# Patient Record
Sex: Male | Born: 1965
Health system: Southern US, Community
[De-identification: ages and names within clinical notes are randomized; demographics above are authoritative.]

## PROBLEM LIST (undated history)

## (undated) DIAGNOSIS — R011 Cardiac murmur, unspecified: Secondary | ICD-10-CM

## (undated) DIAGNOSIS — E119 Type 2 diabetes mellitus without complications: Secondary | ICD-10-CM

## (undated) DIAGNOSIS — I10 Essential (primary) hypertension: Secondary | ICD-10-CM

## (undated) SURGERY — Surgical Case
Anesthesia: *Unknown

---

## 1998-07-29 ENCOUNTER — Emergency Department (HOSPITAL_COMMUNITY): Admission: EM | Admit: 1998-07-29 | Discharge: 1998-07-30 | Payer: Self-pay | Admitting: Internal Medicine

## 1999-06-05 ENCOUNTER — Ambulatory Visit (HOSPITAL_COMMUNITY): Admission: RE | Admit: 1999-06-05 | Discharge: 1999-06-05 | Payer: Self-pay | Admitting: Cardiology

## 1999-08-17 ENCOUNTER — Ambulatory Visit (HOSPITAL_COMMUNITY): Admission: RE | Admit: 1999-08-17 | Discharge: 1999-08-17 | Payer: Self-pay | Admitting: Gastroenterology

## 2004-03-23 ENCOUNTER — Ambulatory Visit (HOSPITAL_COMMUNITY): Admission: RE | Admit: 2004-03-23 | Discharge: 2004-03-23 | Payer: Self-pay | Admitting: Cardiology

## 2004-04-01 ENCOUNTER — Ambulatory Visit: Admission: RE | Admit: 2004-04-01 | Discharge: 2004-04-01 | Payer: Self-pay | Admitting: Cardiology

## 2004-04-01 ENCOUNTER — Encounter (INDEPENDENT_AMBULATORY_CARE_PROVIDER_SITE_OTHER): Payer: Self-pay | Admitting: Cardiology

## 2005-03-18 ENCOUNTER — Encounter: Admission: RE | Admit: 2005-03-18 | Discharge: 2005-03-18 | Payer: Self-pay | Admitting: Internal Medicine

## 2008-12-27 HISTORY — PX: BACK SURGERY: SHX140

## 2009-06-02 ENCOUNTER — Emergency Department (HOSPITAL_COMMUNITY): Admission: EM | Admit: 2009-06-02 | Discharge: 2009-06-02 | Payer: Self-pay | Admitting: Emergency Medicine

## 2009-08-27 ENCOUNTER — Emergency Department (HOSPITAL_COMMUNITY): Admission: EM | Admit: 2009-08-27 | Discharge: 2009-08-27 | Payer: Self-pay | Admitting: Family Medicine

## 2009-10-31 ENCOUNTER — Ambulatory Visit (HOSPITAL_COMMUNITY): Admission: RE | Admit: 2009-10-31 | Discharge: 2009-11-01 | Payer: Self-pay | Admitting: Neurosurgery

## 2011-03-31 LAB — CBC
Hemoglobin: 14.9 g/dL (ref 13.0–17.0)
MCHC: 33.5 g/dL (ref 30.0–36.0)
MCV: 80.2 fL (ref 78.0–100.0)
RBC: 5.52 MIL/uL (ref 4.22–5.81)
RDW: 13.5 % (ref 11.5–15.5)

## 2011-03-31 LAB — BASIC METABOLIC PANEL
GFR calc Af Amer: >60 mL/min (ref >60)
Glucose, Bld: 124 mg/dL — ABNORMAL HIGH (ref 70–99)
Potassium: 4.1 mEq/L (ref 3.5–5.1)

## 2011-04-22 ENCOUNTER — Inpatient Hospital Stay (INDEPENDENT_AMBULATORY_CARE_PROVIDER_SITE_OTHER)
Admission: RE | Admit: 2011-04-22 | Discharge: 2011-04-22 | Disposition: A | Discharge status: Home / Self Care | Payer: PRIVATE HEALTH INSURANCE | Source: Ambulatory Visit | Attending: Emergency Medicine | Admitting: Emergency Medicine

## 2011-04-22 DIAGNOSIS — T23009A Burn of unspecified degree of unspecified hand, unspecified site, initial encounter: Secondary | ICD-10-CM

## 2014-09-17 ENCOUNTER — Other Ambulatory Visit: Payer: Self-pay | Admitting: Orthopedic Surgery

## 2014-09-17 DIAGNOSIS — M5416 Radiculopathy, lumbar region: Secondary | ICD-10-CM

## 2014-09-23 ENCOUNTER — Ambulatory Visit
Admission: RE | Admit: 2014-09-23 | Discharge: 2014-09-23 | Disposition: A | Discharge status: Home / Self Care | Payer: Worker's Compensation | Source: Ambulatory Visit | Attending: Orthopedic Surgery | Admitting: Orthopedic Surgery

## 2014-09-23 DIAGNOSIS — M5416 Radiculopathy, lumbar region: Secondary | ICD-10-CM

## 2014-09-23 MED ORDER — IOHEXOL 180 MG/ML  SOLN
1.0000 mL | Freq: Once | INTRAMUSCULAR | Status: AC | PRN
  Administered 2014-09-23: 1 mL via EPIDURAL

## 2014-09-23 MED ORDER — METHYLPREDNISOLONE ACETATE 40 MG/ML INJ SUSP (RADIOLOG
120.0000 mg | Freq: Once | INTRAMUSCULAR | Status: AC
  Administered 2014-09-23: 120 mg via EPIDURAL

## 2014-09-23 NOTE — Discharge Instructions (Signed)

## 2014-11-28 ENCOUNTER — Other Ambulatory Visit: Payer: Self-pay | Admitting: Orthopedic Surgery

## 2014-12-13 ENCOUNTER — Encounter (HOSPITAL_COMMUNITY): Payer: Self-pay | Admitting: Pharmacy Technician

## 2014-12-13 NOTE — Pre-Procedure Instructions (Addendum)
Devin Gilmore  12/13/2014   Your procedure is scheduled on:  12/25/14  [facility] at 730 AM.  Call this number if you have problems the morning of surgery: [phone]   Remember:   Do not eat food or drink liquids after midnight.   Take these medicines the morning of surgery with A SIP OF WATER: flexeril,pain med if needed    STOP all herbel meds, nsaids (aleve,naproxen,advil,ibuprofen) 5 days prior to surgery starting 12/20/14 including vitamins, aspirin    Do not wear jewelry, make-up or nail polish.  Do not wear lotions, powders, or perfumes. You may wear deodorant.  Do not shave 48 hours prior to surgery. Men may shave face and neck.  Do not bring valuables to the hospital.  [facility] not responsible                  for any belongings or valuables.               Contacts, dentures or bridgework may not be worn into surgery.  Leave suitcase in the car. After surgery it may be brought to your room.  For patients admitted to the hospital, discharge time is determined by your                treatment team.               Patients discharged the day of surgery will not be allowed to drive  home.  Name and phone number of your driver:   Special Instructions:  Special Instructions: Devin Gilmore - Preparing for Surgery  Before surgery, you can play an important role.  Because skin is not sterile, your skin needs to be as free of germs as possible.  You can reduce the number of germs on you skin by washing with CHG (chlorahexidine gluconate) soap before surgery.  CHG is an antiseptic cleaner which kills germs and bonds with the skin to continue killing germs even after washing.  Please DO NOT use if you have an allergy to CHG or antibacterial soaps.  If your skin becomes reddened/irritated stop using the CHG and inform your nurse when you arrive at Short Stay.  Do not shave (including legs and underarms) for at least 48 hours prior to the first  CHG shower.  You may shave your face.  Please follow these instructions carefully:   1.  Shower with CHG Soap the night before surgery and the morning of Surgery.  2.  If you choose to wash your hair, wash your hair first as usual with your normal shampoo.  3.  After you shampoo, rinse your hair and body thoroughly to remove the Shampoo.  4.  Use CHG as you would any other liquid soap.  You can apply chg directly  to the skin and wash gently with scrungie or a clean washcloth.  5.  Apply the CHG Soap to your body ONLY FROM THE NECK DOWN.  Do not use on open wounds or open sores.  Avoid contact with your eyes ears, mouth and genitals (private parts).  Wash genitals (private parts)       with your normal soap.  6.  Wash thoroughly, paying special attention to the area where your surgery will be performed.  7.  Thoroughly rinse your body with warm water from the neck down.  8.  DO NOT shower/wash with your normal soap after using and rinsing off the CHG Soap.  9.  Pat yourself dry with a clean towel.            10.  Wear clean pajamas.            11.  Place clean sheets on your bed the night of your first shower and do not sleep with pets.  Day of Surgery  Do not apply any lotions/deodorants the morning of surgery.  Please wear clean clothes to the hospital/surgery center.   Please read over the following fact sheets that you were given: Pain Booklet, Coughing and Deep Breathing, Blood Transfusion Information, MRSA Information and Surgical Site Infection Prevention

## 2014-12-16 ENCOUNTER — Encounter (HOSPITAL_COMMUNITY): Payer: Self-pay

## 2014-12-16 ENCOUNTER — Encounter (HOSPITAL_COMMUNITY)
Admission: RE | Admit: 2014-12-16 | Discharge: 2014-12-16 | Disposition: A | Discharge status: Home / Self Care | Payer: Worker's Compensation | Source: Ambulatory Visit | Attending: Orthopedic Surgery | Admitting: Orthopedic Surgery

## 2014-12-16 DIAGNOSIS — R011 Cardiac murmur, unspecified: Secondary | ICD-10-CM | POA: Insufficient documentation

## 2014-12-16 DIAGNOSIS — Z01818 Encounter for other preprocedural examination: Secondary | ICD-10-CM | POA: Insufficient documentation

## 2014-12-16 DIAGNOSIS — I454 Nonspecific intraventricular block: Secondary | ICD-10-CM | POA: Diagnosis not present

## 2014-12-16 HISTORY — DX: Cardiac murmur, unspecified: R01.1

## 2014-12-16 LAB — CBC WITH DIFFERENTIAL/PLATELET
Basophils Absolute: 0 10*3/uL (ref 0.0–0.1)
Basophils Relative: 0 % (ref 0–1)
EOS PCT: 3 % (ref 0–5)
Eosinophils Absolute: 0.2 10*3/uL (ref 0.0–0.7)
HEMATOCRIT: 42.7 % (ref 39.0–52.0)
Hemoglobin: 14.3 g/dL (ref 13.0–17.0)
LYMPHS ABS: 1.7 10*3/uL (ref 0.7–4.0)
LYMPHS PCT: 28 % (ref 12–46)
MCH: 26.4 pg (ref 26.0–34.0)
MCHC: 33.5 g/dL (ref 30.0–36.0)
MCV: 78.9 fL (ref 78.0–100.0)
MONO ABS: 0.7 10*3/uL (ref 0.1–1.0)
Monocytes Relative: 11 % (ref 3–12)
Neutro Abs: 3.5 10*3/uL (ref 1.7–7.7)
Neutrophils Relative %: 58 % (ref 43–77)
Platelets: 262 10*3/uL (ref 150–400)
RBC: 5.41 MIL/uL (ref 4.22–5.81)
RDW: 13.7 % (ref 11.5–15.5)
WBC: 6 10*3/uL (ref 4.0–10.5)

## 2014-12-16 LAB — COMPREHENSIVE METABOLIC PANEL
ALT: 28 U/L (ref 0–53)
ANION GAP: 12 (ref 5–15)
AST: 22 U/L (ref 0–37)
Albumin: 3.9 g/dL (ref 3.5–5.2)
Alkaline Phosphatase: 75 U/L (ref 39–117)
BUN: 11 mg/dL (ref 6–23)
CO2: 27 meq/L (ref 19–32)
CREATININE: 1.01 mg/dL (ref 0.50–1.35)
Calcium: 9.2 mg/dL (ref 8.4–10.5)
Chloride: 101 mEq/L (ref 96–112)
GFR calc Af Amer: >90 mL/min (ref >90)
GFR, EST NON AFRICAN AMERICAN: 86 mL/min — AB (ref >90)
GLUCOSE: 234 mg/dL — AB (ref 70–99)
Potassium: 4.4 mEq/L (ref 3.7–5.3)
SODIUM: 140 meq/L (ref 137–147)
TOTAL PROTEIN: 8.1 g/dL (ref 6.0–8.3)
Total Bilirubin: 0.3 mg/dL (ref 0.3–1.2)

## 2014-12-16 LAB — URINALYSIS, ROUTINE W REFLEX MICROSCOPIC
Bilirubin Urine: NEGATIVE
Glucose, UA: NEGATIVE mg/dL
Hgb urine dipstick: NEGATIVE
KETONES UR: NEGATIVE mg/dL
LEUKOCYTES UA: NEGATIVE
NITRITE: NEGATIVE
PROTEIN: NEGATIVE mg/dL
Specific Gravity, Urine: 1.026 (ref 1.005–1.030)
UROBILINOGEN UA: 0.2 mg/dL (ref 0.0–1.0)
pH: 5.5 (ref 5.0–8.0)

## 2014-12-16 LAB — APTT: aPTT: 32 seconds (ref 24–37)

## 2014-12-16 LAB — SURGICAL PCR SCREEN
MRSA, PCR: NEGATIVE
Staphylococcus aureus: NEGATIVE

## 2014-12-16 LAB — PROTIME-INR
INR: 0.96 (ref 0.00–1.49)
PROTHROMBIN TIME: 12.9 s (ref 11.6–15.2)

## 2014-12-24 MED ORDER — POVIDONE-IODINE 7.5 % EX SOLN
Freq: Once | CUTANEOUS | Status: DC
  Filled 2014-12-24: qty 118

## 2014-12-24 MED ORDER — CEFAZOLIN SODIUM-DEXTROSE 2-3 GM-% IV SOLR
2.0000 g | INTRAVENOUS | Status: AC
  Administered 2014-12-25 (×2): 2 g via INTRAVENOUS
  Filled 2014-12-24: qty 50

## 2014-12-25 ENCOUNTER — Inpatient Hospital Stay (HOSPITAL_COMMUNITY): Payer: Worker's Compensation | Admitting: Anesthesiology

## 2014-12-25 ENCOUNTER — Encounter (HOSPITAL_COMMUNITY)
Admission: RE | Disposition: A | Discharge status: Home Health | Payer: PRIVATE HEALTH INSURANCE | Source: Ambulatory Visit | Attending: Orthopedic Surgery

## 2014-12-25 ENCOUNTER — Inpatient Hospital Stay (HOSPITAL_COMMUNITY): Payer: Worker's Compensation

## 2014-12-25 ENCOUNTER — Inpatient Hospital Stay (HOSPITAL_COMMUNITY)
Admission: RE | Admit: 2014-12-25 | Discharge: 2014-12-28 | DRG: 460 | Disposition: A | Discharge status: Home Health | Payer: Worker's Compensation | Source: Ambulatory Visit | Attending: Orthopedic Surgery | Admitting: Orthopedic Surgery

## 2014-12-25 DIAGNOSIS — M79604 Pain in right leg: Secondary | ICD-10-CM

## 2014-12-25 DIAGNOSIS — M5116 Intervertebral disc disorders with radiculopathy, lumbar region: Secondary | ICD-10-CM | POA: Diagnosis present

## 2014-12-25 DIAGNOSIS — M5416 Radiculopathy, lumbar region: Principal | ICD-10-CM | POA: Diagnosis present

## 2014-12-25 DIAGNOSIS — M5410 Radiculopathy, site unspecified: Secondary | ICD-10-CM | POA: Diagnosis present

## 2014-12-25 DIAGNOSIS — Z01818 Encounter for other preprocedural examination: Secondary | ICD-10-CM

## 2014-12-25 DIAGNOSIS — I10 Essential (primary) hypertension: Secondary | ICD-10-CM | POA: Diagnosis present

## 2014-12-25 DIAGNOSIS — Z419 Encounter for procedure for purposes other than remedying health state, unspecified: Secondary | ICD-10-CM

## 2014-12-25 HISTORY — PX: SPINAL CORD DECOMPRESSION: SHX97

## 2014-12-25 LAB — TYPE AND SCREEN
ABO/RH(D): O POS
Antibody Screen: NEGATIVE

## 2014-12-25 LAB — ABO/RH: ABO/RH(D): O POS

## 2014-12-25 SURGERY — POSTERIOR LUMBAR FUSION 1 LEVEL
Anesthesia: General | Site: Spine Lumbar | Laterality: Right

## 2014-12-25 MED ORDER — THROMBIN 20000 UNITS EX SOLR
CUTANEOUS | Status: DC | PRN
  Administered 2014-12-25: 20000 mL via TOPICAL

## 2014-12-25 MED ORDER — DIPHENHYDRAMINE HCL 50 MG/ML IJ SOLN: 12.5000 mg | Freq: Four times a day (QID) | INTRAMUSCULAR | Status: DC | PRN

## 2014-12-25 MED ORDER — SODIUM CHLORIDE 0.9 % IJ SOLN
9.0000 mL | INTRAMUSCULAR | Status: DC | PRN
Start: 2014-12-25 — End: 2014-12-26

## 2014-12-25 MED ORDER — PHENOL 1.4 % MT LIQD: 1.0000 | OROMUCOSAL | Status: DC | PRN

## 2014-12-25 MED ORDER — ACETAMINOPHEN 650 MG RE SUPP: 650.0000 mg | RECTAL | Status: DC | PRN

## 2014-12-25 MED ORDER — MORPHINE SULFATE 2 MG/ML IJ SOLN
1.0000 mg | INTRAMUSCULAR | Status: DC | PRN
  Administered 2014-12-26 – 2014-12-27 (×4): 4 mg via INTRAVENOUS
  Administered 2014-12-27 (×2): 2 mg via INTRAVENOUS
  Filled 2014-12-25 (×2): qty 1
  Filled 2014-12-25 (×3): qty 2
  Filled 2014-12-25: qty 1
  Filled 2014-12-25: qty 2

## 2014-12-25 MED ORDER — LIDOCAINE HCL (CARDIAC) 20 MG/ML IV SOLN
INTRAVENOUS | Status: AC
  Filled 2014-12-25: qty 5

## 2014-12-25 MED ORDER — NALOXONE HCL 0.4 MG/ML IJ SOLN: 0.4000 mg | INTRAMUSCULAR | Status: DC | PRN

## 2014-12-25 MED ORDER — ONDANSETRON HCL 4 MG/2ML IJ SOLN
4.0000 mg | Freq: Four times a day (QID) | INTRAMUSCULAR | Status: DC | PRN
  Administered 2014-12-26: 4 mg via INTRAVENOUS
  Filled 2014-12-25: qty 2

## 2014-12-25 MED ORDER — FLEET ENEMA 7-19 GM/118ML RE ENEM: 1.0000 | ENEMA | Freq: Once | RECTAL | Status: AC | PRN

## 2014-12-25 MED ORDER — LIDOCAINE HCL 4 % MT SOLN
OROMUCOSAL | Status: DC | PRN
  Administered 2014-12-25: 4 mL via TOPICAL

## 2014-12-25 MED ORDER — NEOSTIGMINE METHYLSULFATE 10 MG/10ML IV SOLN
INTRAVENOUS | Status: AC
  Filled 2014-12-25: qty 1

## 2014-12-25 MED ORDER — ROCURONIUM BROMIDE 100 MG/10ML IV SOLN
INTRAVENOUS | Status: DC | PRN
  Administered 2014-12-25: 10 mg via INTRAVENOUS
  Administered 2014-12-25: 5 mg via INTRAVENOUS
  Administered 2014-12-25: 50 mg via INTRAVENOUS

## 2014-12-25 MED ORDER — DIPHENHYDRAMINE HCL 12.5 MG/5ML PO ELIX
12.5000 mg | ORAL_SOLUTION | Freq: Four times a day (QID) | ORAL | Status: DC | PRN
Start: 2014-12-25 — End: 2014-12-26

## 2014-12-25 MED ORDER — MENTHOL 3 MG MT LOZG: 1.0000 | LOZENGE | OROMUCOSAL | Status: DC | PRN

## 2014-12-25 MED ORDER — SODIUM CHLORIDE 0.9 % IV SOLN
INTRAVENOUS | Status: DC | PRN
  Administered 2014-12-25: 15:00:00 via INTRAVENOUS

## 2014-12-25 MED ORDER — SODIUM CHLORIDE 0.9 % IJ SOLN
3.0000 mL | Freq: Two times a day (BID) | INTRAMUSCULAR | Status: DC
  Administered 2014-12-26 – 2014-12-28 (×2): 3 mL via INTRAVENOUS

## 2014-12-25 MED ORDER — ROCURONIUM BROMIDE 50 MG/5ML IV SOLN
INTRAVENOUS | Status: AC
  Filled 2014-12-25: qty 1

## 2014-12-25 MED ORDER — SUFENTANIL CITRATE 50 MCG/ML IV SOLN
INTRAVENOUS | Status: AC
  Filled 2014-12-25: qty 1

## 2014-12-25 MED ORDER — BISACODYL 5 MG PO TBEC: 5.0000 mg | DELAYED_RELEASE_TABLET | Freq: Every day | ORAL | Status: DC | PRN

## 2014-12-25 MED ORDER — SODIUM CHLORIDE 0.9 % IV SOLN
100.2000 mg | INTRAVENOUS | Status: DC | PRN
  Administered 2014-12-25: 100.2 mg via INTRAVENOUS

## 2014-12-25 MED ORDER — ONDANSETRON HCL 4 MG/2ML IJ SOLN: 4.0000 mg | INTRAMUSCULAR | Status: DC | PRN

## 2014-12-25 MED ORDER — PROPOFOL 10 MG/ML IV BOLUS
INTRAVENOUS | Status: AC
  Filled 2014-12-25: qty 20

## 2014-12-25 MED ORDER — ARTIFICIAL TEARS OP OINT
TOPICAL_OINTMENT | OPHTHALMIC | Status: AC
  Filled 2014-12-25: qty 3.5

## 2014-12-25 MED ORDER — ALUM & MAG HYDROXIDE-SIMETH 200-200-20 MG/5ML PO SUSP: 30.0000 mL | Freq: Four times a day (QID) | ORAL | Status: DC | PRN

## 2014-12-25 MED ORDER — ACETAMINOPHEN 325 MG PO TABS: 650.0000 mg | ORAL_TABLET | ORAL | Status: DC | PRN

## 2014-12-25 MED ORDER — LACTATED RINGERS IV SOLN
INTRAVENOUS | Status: DC | PRN
  Administered 2014-12-25 (×3): via INTRAVENOUS

## 2014-12-25 MED ORDER — 0.9 % SODIUM CHLORIDE (POUR BTL) OPTIME
TOPICAL | Status: DC | PRN
  Administered 2014-12-25: 1000 mL

## 2014-12-25 MED ORDER — SENNOSIDES-DOCUSATE SODIUM 8.6-50 MG PO TABS: 1.0000 | ORAL_TABLET | Freq: Every evening | ORAL | Status: DC | PRN

## 2014-12-25 MED ORDER — NEOSTIGMINE METHYLSULFATE 10 MG/10ML IV SOLN
INTRAVENOUS | Status: DC | PRN
  Administered 2014-12-25: 3 mg via INTRAVENOUS

## 2014-12-25 MED ORDER — FENTANYL CITRATE 0.05 MG/ML IJ SOLN
25.0000 ug | INTRAMUSCULAR | Status: DC | PRN
  Administered 2014-12-25 (×2): 50 ug via INTRAVENOUS

## 2014-12-25 MED ORDER — PROPOFOL INFUSION 10 MG/ML OPTIME
INTRAVENOUS | Status: DC | PRN
  Administered 2014-12-25: 50 ug/kg/min via INTRAVENOUS

## 2014-12-25 MED ORDER — MEPERIDINE HCL 25 MG/ML IJ SOLN: 6.2500 mg | INTRAMUSCULAR | Status: DC | PRN

## 2014-12-25 MED ORDER — PROPOFOL 10 MG/ML IV BOLUS
INTRAVENOUS | Status: DC | PRN
  Administered 2014-12-25: 180 mg via INTRAVENOUS
  Administered 2014-12-25: 30 mg via INTRAVENOUS

## 2014-12-25 MED ORDER — CEFAZOLIN SODIUM 1-5 GM-% IV SOLN
1.0000 g | Freq: Three times a day (TID) | INTRAVENOUS | Status: AC
  Administered 2014-12-26 (×2): 1 g via INTRAVENOUS
  Filled 2014-12-25 (×2): qty 50

## 2014-12-25 MED ORDER — SUFENTANIL CITRATE 50 MCG/ML IV SOLN
INTRAVENOUS | Status: DC | PRN
  Administered 2014-12-25 (×2): 10 ug via INTRAVENOUS
  Administered 2014-12-25 (×2): 5 ug via INTRAVENOUS
  Administered 2014-12-25 (×4): 10 ug via INTRAVENOUS
  Administered 2014-12-25: 5 ug via INTRAVENOUS

## 2014-12-25 MED ORDER — SODIUM CHLORIDE 0.9 % IJ SOLN
INTRAMUSCULAR | Status: AC
  Filled 2014-12-25: qty 20

## 2014-12-25 MED ORDER — OXYCODONE-ACETAMINOPHEN 5-325 MG PO TABS
1.0000 | ORAL_TABLET | ORAL | Status: DC | PRN
  Administered 2014-12-26 – 2014-12-28 (×12): 2 via ORAL
  Filled 2014-12-25 (×13): qty 2

## 2014-12-25 MED ORDER — MORPHINE SULFATE (PF) 1 MG/ML IV SOLN
INTRAVENOUS | Status: DC
Start: 2014-12-25 — End: 2014-12-26
  Administered 2014-12-25 (×2): via INTRAVENOUS
  Administered 2014-12-25: 6 mg via INTRAVENOUS
  Administered 2014-12-26: 03:00:00 via INTRAVENOUS
  Filled 2014-12-25 (×2): qty 25

## 2014-12-25 MED ORDER — DOCUSATE SODIUM 100 MG PO CAPS
100.0000 mg | ORAL_CAPSULE | Freq: Two times a day (BID) | ORAL | Status: DC
  Administered 2014-12-26 – 2014-12-28 (×6): 100 mg via ORAL
  Filled 2014-12-25 (×9): qty 1

## 2014-12-25 MED ORDER — GLYCOPYRROLATE 0.2 MG/ML IJ SOLN
INTRAMUSCULAR | Status: AC
  Filled 2014-12-25: qty 2

## 2014-12-25 MED ORDER — SUCCINYLCHOLINE CHLORIDE 20 MG/ML IJ SOLN
INTRAMUSCULAR | Status: DC | PRN
  Administered 2014-12-25: 100 mg via INTRAVENOUS

## 2014-12-25 MED ORDER — ARTIFICIAL TEARS OP OINT
TOPICAL_OINTMENT | OPHTHALMIC | Status: DC | PRN
  Administered 2014-12-25: 1 via OPHTHALMIC

## 2014-12-25 MED ORDER — LIDOCAINE HCL (CARDIAC) 20 MG/ML IV SOLN
INTRAVENOUS | Status: DC | PRN
  Administered 2014-12-25: 100 mg via INTRAVENOUS

## 2014-12-25 MED ORDER — ONDANSETRON HCL 4 MG/2ML IJ SOLN
INTRAMUSCULAR | Status: AC
  Filled 2014-12-25: qty 2

## 2014-12-25 MED ORDER — MIDAZOLAM HCL 5 MG/5ML IJ SOLN
INTRAMUSCULAR | Status: DC | PRN
  Administered 2014-12-25: 2 mg via INTRAVENOUS

## 2014-12-25 MED ORDER — LACTATED RINGERS IV SOLN
INTRAVENOUS | Status: DC
  Administered 2014-12-25: 08:00:00 via INTRAVENOUS

## 2014-12-25 MED ORDER — ONDANSETRON HCL 4 MG/2ML IJ SOLN
INTRAMUSCULAR | Status: DC | PRN
  Administered 2014-12-25: 4 mg via INTRAVENOUS

## 2014-12-25 MED ORDER — BUPIVACAINE-EPINEPHRINE (PF) 0.25% -1:200000 IJ SOLN
INTRAMUSCULAR | Status: AC
  Filled 2014-12-25: qty 30

## 2014-12-25 MED ORDER — THROMBIN 20000 UNITS EX KIT
PACK | CUTANEOUS | Status: DC | PRN
  Administered 2014-12-25: 40000 [IU] via TOPICAL

## 2014-12-25 MED ORDER — DIAZEPAM 5 MG PO TABS
5.0000 mg | ORAL_TABLET | Freq: Four times a day (QID) | ORAL | Status: DC | PRN
  Administered 2014-12-25 – 2014-12-28 (×10): 5 mg via ORAL
  Filled 2014-12-25 (×10): qty 1

## 2014-12-25 MED ORDER — SODIUM CHLORIDE 0.9 % IJ SOLN: 3.0000 mL | INTRAMUSCULAR | Status: DC | PRN

## 2014-12-25 MED ORDER — GLYCOPYRROLATE 0.2 MG/ML IJ SOLN
INTRAMUSCULAR | Status: DC | PRN
  Administered 2014-12-25: 0.4 mg via INTRAVENOUS

## 2014-12-25 MED ORDER — GABAPENTIN 300 MG PO CAPS
300.0000 mg | ORAL_CAPSULE | Freq: Three times a day (TID) | ORAL | Status: DC
  Administered 2014-12-26 – 2014-12-28 (×8): 300 mg via ORAL
  Filled 2014-12-25 (×11): qty 1

## 2014-12-25 MED ORDER — SODIUM CHLORIDE 0.9 % IV SOLN
INTRAVENOUS | Status: DC
  Administered 2014-12-25 – 2014-12-26 (×2): via INTRAVENOUS

## 2014-12-25 MED ORDER — THROMBIN 20000 UNITS EX SOLR
CUTANEOUS | Status: AC
  Filled 2014-12-25: qty 40000

## 2014-12-25 MED ORDER — MORPHINE SULFATE (PF) 1 MG/ML IV SOLN
INTRAVENOUS | Status: AC
  Filled 2014-12-25: qty 25

## 2014-12-25 MED ORDER — SODIUM CHLORIDE 0.9 % IV SOLN: 250.0000 mL | INTRAVENOUS | Status: DC

## 2014-12-25 MED ORDER — MIDAZOLAM HCL 2 MG/2ML IJ SOLN
INTRAMUSCULAR | Status: AC
  Filled 2014-12-25: qty 2

## 2014-12-25 MED ORDER — PROMETHAZINE HCL 25 MG/ML IJ SOLN
6.2500 mg | INTRAMUSCULAR | Status: DC | PRN
Start: 2014-12-25 — End: 2014-12-25

## 2014-12-25 MED ORDER — FENTANYL CITRATE 0.05 MG/ML IJ SOLN
INTRAMUSCULAR | Status: AC
  Administered 2014-12-25: 50 ug via INTRAVENOUS
  Filled 2014-12-25: qty 2

## 2014-12-25 MED ORDER — METHYLENE BLUE 1 % INJ SOLN
INTRAMUSCULAR | Status: AC
  Filled 2014-12-25: qty 10

## 2014-12-25 SURGICAL SUPPLY — 85 items
BENZOIN TINCTURE PRP APPL 2/3 (GAUZE/BANDAGES/DRESSINGS) ×2 IMPLANT
BIT DRILL MOUNTAINER FXED 12MM (DRILL) ×1 IMPLANT
BLADE SURG ROTATE 9660 (MISCELLANEOUS) IMPLANT
BUR PRESCISION 1.7 ELITE (BURR) ×2 IMPLANT
BUR ROUND PRECISION 4.0 (BURR) ×2 IMPLANT
CAGE BULLET CONCORDE 9X9X27 (Cage) ×2 IMPLANT
CARTRIDGE OIL MAESTRO DRILL (MISCELLANEOUS) ×1 IMPLANT
CLSR STERI-STRIP ANTIMIC 1/2X4 (GAUZE/BANDAGES/DRESSINGS) ×2 IMPLANT
CONT SPEC STER OR (MISCELLANEOUS) ×2 IMPLANT
CORDS BIPOLAR (ELECTRODE) ×2 IMPLANT
COVER MAYO STAND STRL (DRAPES) ×4 IMPLANT
COVER SURGICAL LIGHT HANDLE (MISCELLANEOUS) ×2 IMPLANT
DIFFUSER DRILL AIR PNEUMATIC (MISCELLANEOUS) ×2 IMPLANT
DRAIN CHANNEL 15F RND FF W/TCR (WOUND CARE) IMPLANT
DRAPE C-ARM 42X72 X-RAY (DRAPES) ×2 IMPLANT
DRAPE ORTHO SPLIT 77X108 STRL (DRAPES) ×1
DRAPE POUCH INSTRU U-SHP 10X18 (DRAPES) ×2 IMPLANT
DRAPE SURG 17X23 STRL (DRAPES) ×6 IMPLANT
DRAPE SURG ORHT 6 SPLT 77X108 (DRAPES) ×1 IMPLANT
DRILL MOUNTAINEER FIXED 12MM (DRILL) ×2
DURAPREP 26ML APPLICATOR (WOUND CARE) ×4 IMPLANT
ELECT BLADE 4.0 EZ CLEAN MEGAD (MISCELLANEOUS) ×2
ELECT CAUTERY BLADE 6.4 (BLADE) ×2 IMPLANT
ELECT REM PT RETURN 9FT ADLT (ELECTROSURGICAL) ×2
ELECTRODE BLDE 4.0 EZ CLN MEGD (MISCELLANEOUS) ×1 IMPLANT
ELECTRODE REM PT RTRN 9FT ADLT (ELECTROSURGICAL) ×1 IMPLANT
EVACUATOR SILICONE 100CC (DRAIN) IMPLANT
GAUZE SPONGE 4X4 12PLY STRL (GAUZE/BANDAGES/DRESSINGS) ×2 IMPLANT
GAUZE SPONGE 4X4 16PLY XRAY LF (GAUZE/BANDAGES/DRESSINGS) ×8 IMPLANT
GLOVE BIO SURGEON STRL SZ7 (GLOVE) ×2 IMPLANT
GLOVE BIO SURGEON STRL SZ8 (GLOVE) ×2 IMPLANT
GLOVE BIOGEL PI IND STRL 7.0 (GLOVE) ×1 IMPLANT
GLOVE BIOGEL PI IND STRL 8 (GLOVE) ×2 IMPLANT
GLOVE BIOGEL PI INDICATOR 7.0 (GLOVE) ×1
GLOVE BIOGEL PI INDICATOR 8 (GLOVE) ×2
GOWN STRL REUS W/ TWL LRG LVL3 (GOWN DISPOSABLE) ×2 IMPLANT
GOWN STRL REUS W/ TWL XL LVL3 (GOWN DISPOSABLE) ×1 IMPLANT
GOWN STRL REUS W/TWL LRG LVL3 (GOWN DISPOSABLE) ×2
GOWN STRL REUS W/TWL XL LVL3 (GOWN DISPOSABLE) ×1
IV CATH 14GX2 1/4 (CATHETERS) ×2 IMPLANT
KIT BASIN OR (CUSTOM PROCEDURE TRAY) ×4 IMPLANT
KIT POSITION SURG JACKSON T1 (MISCELLANEOUS) ×2 IMPLANT
KIT ROOM TURNOVER OR (KITS) ×2 IMPLANT
MARKER SKIN DUAL TIP RULER LAB (MISCELLANEOUS) ×2 IMPLANT
MIX DBX 10CC 35% BONE (Bone Implant) ×2 IMPLANT
NDL SAFETY ECLIPSE 18X1.5 (NEEDLE) ×1 IMPLANT
NEEDLE 22X1 1/2 (OR ONLY) (NEEDLE) ×2 IMPLANT
NEEDLE BONE MARROW 8GX6 FENEST (NEEDLE) IMPLANT
NEEDLE HYPO 18GX1.5 SHARP (NEEDLE) ×1
NEEDLE HYPO 25GX1X1/2 BEV (NEEDLE) ×2 IMPLANT
NEEDLE SPNL 18GX3.5 QUINCKE PK (NEEDLE) ×8 IMPLANT
NEEDLE SPNL 20GX3.5 QUINCKE YW (NEEDLE) ×4 IMPLANT
NEURO MONITORING STIM (LABOR (TRAVEL & OVERTIME)) ×2 IMPLANT
NS IRRIG 1000ML POUR BTL (IV SOLUTION) ×8 IMPLANT
OIL CARTRIDGE MAESTRO DRILL (MISCELLANEOUS) ×2
PACK LAMINECTOMY ORTHO (CUSTOM PROCEDURE TRAY) ×2 IMPLANT
PACK UNIVERSAL I (CUSTOM PROCEDURE TRAY) ×4 IMPLANT
PAD ARMBOARD 7.5X6 YLW CONV (MISCELLANEOUS) ×4 IMPLANT
PATTIES SURGICAL .5 X1 (DISPOSABLE) ×2 IMPLANT
PATTIES SURGICAL .5X1.5 (GAUZE/BANDAGES/DRESSINGS) ×2 IMPLANT
ROD PRE LORDOSED 5.5X45 (Rod) ×4 IMPLANT
SCREW SET SINGLE INNER (Screw) ×8 IMPLANT
SCREW VIPER CORT FIX 5.00X35 (Screw) ×6 IMPLANT
SCREW VIPER CORT FIX 5.00X40 (Screw) ×2 IMPLANT
SPONGE GAUZE 4X4 12PLY STER LF (GAUZE/BANDAGES/DRESSINGS) ×2 IMPLANT
SPONGE INTESTINAL PEANUT (DISPOSABLE) ×4 IMPLANT
SPONGE SURGIFOAM ABS GEL 100 (HEMOSTASIS) ×2 IMPLANT
STRIP CLOSURE SKIN 1/2X4 (GAUZE/BANDAGES/DRESSINGS) ×4 IMPLANT
SURGIFLO TRUKIT (HEMOSTASIS) IMPLANT
SUT MNCRL AB 4-0 PS2 18 (SUTURE) ×4 IMPLANT
SUT VIC AB 0 CT1 18XCR BRD 8 (SUTURE) ×1 IMPLANT
SUT VIC AB 0 CT1 8-18 (SUTURE) ×1
SUT VIC AB 1 CT1 18XCR BRD 8 (SUTURE) ×2 IMPLANT
SUT VIC AB 1 CT1 8-18 (SUTURE) ×4
SUT VIC AB 2-0 CT2 18 VCP726D (SUTURE) ×2 IMPLANT
SYR 20CC LL (SYRINGE) ×2 IMPLANT
SYR BULB IRRIGATION 50ML (SYRINGE) ×2 IMPLANT
SYR CONTROL 10ML LL (SYRINGE) ×4 IMPLANT
SYR TB 1ML LUER SLIP (SYRINGE) ×2 IMPLANT
TAPE CLOTH SURG 4X10 WHT LF (GAUZE/BANDAGES/DRESSINGS) ×2 IMPLANT
TOWEL OR 17X24 6PK STRL BLUE (TOWEL DISPOSABLE) ×2 IMPLANT
TOWEL OR 17X26 10 PK STRL BLUE (TOWEL DISPOSABLE) ×2 IMPLANT
TRAY FOLEY CATH 16FRSI W/METER (SET/KITS/TRAYS/PACK) ×2 IMPLANT
WATER STERILE IRR 1000ML POUR (IV SOLUTION) ×2 IMPLANT
YANKAUER SUCT BULB TIP NO VENT (SUCTIONS) ×4 IMPLANT

## 2014-12-25 NOTE — Progress Notes (Signed)
[facility]

## 2014-12-25 NOTE — Anesthesia Postprocedure Evaluation (Signed)
  Anesthesia Post-op Note  Patient: Devin Gilmore  Procedure(s) Performed: Procedure(s) with comments: POSTERIOR LUMBAR FUSION 1 LEVEL (Right) - Revision lumbar 3-4 decompression, right sided lumbar 3-4 transforaminal lumbar interbody fusion with instrumentation, allograft.  Patient Location: PACU  Anesthesia Type:General  Level of Consciousness: awake and alert   Airway and Oxygen Therapy: Patient Spontanous Breathing  Post-op Pain: mild  Post-op Assessment: Post-op Vital signs reviewed, Patient's Cardiovascular Status Stable and Respiratory Function Stable  Post-op Vital Signs: Reviewed  Filed Vitals:   12/25/14 1725  BP: 158/94  Pulse:   Temp:   Resp:     Complications: No apparent anesthesia complications

## 2014-12-25 NOTE — Transfer of Care (Signed)
Immediate Anesthesia Transfer of Care Note  Patient: Devin Gilmore  Procedure(s) Performed: Procedure(s) with comments: POSTERIOR LUMBAR FUSION 1 LEVEL (Right) - Revision lumbar 3-4 decompression, right sided lumbar 3-4 transforaminal lumbar interbody fusion with instrumentation, allograft.  Patient Location: PACU  Anesthesia Type:General  Level of Consciousness: awake, sedated and patient cooperative  Airway & Oxygen Therapy: Patient Spontanous Breathing and Patient connected to nasal cannula oxygen  Post-op Assessment: Report given to PACU RN, Post -op Vital signs reviewed and stable and Patient moving all extremities  Post vital signs: Reviewed and stable  Complications: No apparent anesthesia complications

## 2014-12-25 NOTE — H&P (Signed)
     PREOPERATIVE H&P  Chief Complaint: Right leg pain  HPI: Devin Gilmore is a 48 y.o. male who presents with ongoing pain in the right leg  MRI reveals an extraforminal HNP, causing compression of right L3 nerve. Patient is s/p a previous L3/4 decompression.  Patient has failed multiple forms of conservative care and continues to have pain (see office notes for additional details regarding the patient's full course of treatment)  Past Medical History  Diagnosis Date  . Heart murmur     echo 05 no problems   Past Surgical History  Procedure Laterality Date  . Back surgery  2010   History   Social History  . Marital Status: Married    Spouse Name: N/A    Number of Children: N/A  . Years of Education: N/A   Social History Main Topics  . Smoking status: Never Smoker   . Smokeless tobacco: Not on file  . Alcohol Use: No  . Drug Use: No  . Sexual Activity: Not on file   Other Topics Concern  . Not on file   Social History Narrative  . No narrative on file   No family history on file. No Known Allergies Prior to Admission medications   Medication Sig Start Date End Date Taking? Authorizing Provider  cyclobenzaprine (FLEXERIL) 5 MG tablet Take 5 mg by mouth daily as needed for muscle spasms.   Yes Historical Provider, MD  gabapentin (NEURONTIN) 300 MG capsule Take 300 mg by mouth 3 (three) times daily.   Yes Historical Provider, MD  traMADol (ULTRAM) 50 MG tablet Take 50 mg by mouth every 6 (six) hours as needed for moderate pain.   Yes Historical Provider, MD     All other systems have been reviewed and were otherwise negative with the exception of those mentioned in the HPI and as above.  Physical Exam: Filed Vitals:   12/25/14 0748  BP: 152/93  Pulse: 66  Temp: 97.9 F (36.6 C)  Resp: 20    General: Alert, no acute distress Cardiovascular: No pedal edema Respiratory: No cyanosis, no use of accessory musculature Skin: No lesions in the area of  chief complaint Neurologic: Sensation intact distally Psychiatric: Patient is competent for consent with normal mood and affect Lymphatic: No axillary or cervical lymphadenopathy  MUSCULOSKELETAL: 1+ right patellar reflex  Assessment/Plan: Right leg pain Plan for Procedure(s): POSTERIOR LUMBAR DECOMPRESSION AND FUSION 1 LEVEL   Emilee HeroUMONSKI,Devin Brabant LEONARD, MD 12/25/2014 8:01 AM

## 2014-12-25 NOTE — Anesthesia Procedure Notes (Signed)
Procedure Name: Intubation Date/Time: 12/25/2014 11:03 AM Performed by: Izora Gala Pre-anesthesia Checklist: Patient identified, Emergency Drugs available, Suction available and Patient being monitored Patient Re-evaluated:Patient Re-evaluated prior to inductionOxygen Delivery Method: Circle system utilized Preoxygenation: Pre-oxygenation with 100% oxygen Intubation Type: IV induction Ventilation: Mask ventilation without difficulty Laryngoscope Size: Miller and 3 Grade View: Grade II Tube type: Oral Tube size: 8.0 mm Number of attempts: 1 Airway Equipment and Method: Stylet and LTA kit utilized Placement Confirmation: ETT inserted through vocal cords under direct vision,  positive ETCO2 and breath sounds checked- equal and bilateral Secured at: 23 cm Tube secured with: Tape Dental Injury: Teeth and Oropharynx as per pre-operative assessment

## 2014-12-25 NOTE — Anesthesia Preprocedure Evaluation (Addendum)
Anesthesia Evaluation  Patient identified by MRN, date of birth, ID band Patient awake    Reviewed: Allergy & Precautions, H&P , NPO status , Patient's Chart, lab work & pertinent test results, reviewed documented beta blocker date and time   Airway Mallampati: II   Neck ROM: Full    Dental  (+) Teeth Intact   Pulmonary neg pulmonary ROS,  breath sounds clear to auscultation        Cardiovascular Exercise Tolerance: Good hypertension, Rhythm:Regular  ECHO 2005 EF 65%, EKG 11/2014 ST changes, ST changes present in 2010   Neuro/Psych negative neurological ROS  negative psych ROS   GI/Hepatic negative GI ROS, Neg liver ROS,   Endo/Other  negative endocrine ROS  Renal/GU negative Renal ROS     Musculoskeletal   Abdominal (+)  Abdomen: soft.    Peds  Hematology negative hematology ROS (+)   Anesthesia Other Findings   Reproductive/Obstetrics                           Anesthesia Physical Anesthesia Plan  ASA: II  Anesthesia Plan: General   Post-op Pain Management:    Induction: Intravenous  Airway Management Planned: Oral ETT  Additional Equipment:   Intra-op Plan:   Post-operative Plan: Extubation in OR  Informed Consent: I have reviewed the patients History and Physical, chart, labs and discussed the procedure including the risks, benefits and alternatives for the proposed anesthesia with the patient or authorized representative who has indicated his/her understanding and acceptance.     Plan Discussed with:   Anesthesia Plan Comments: (Does not take BP meds, Need careful CV HX and questioning, has good exercise tolerance and no cardiac symptoms)       Anesthesia Quick Evaluation

## 2014-12-26 ENCOUNTER — Encounter (HOSPITAL_COMMUNITY): Payer: Self-pay | Admitting: General Practice

## 2014-12-26 MED FILL — Heparin Sodium (Porcine) Inj 1000 Unit/ML: INTRAMUSCULAR | Qty: 30 | Status: AC

## 2014-12-26 MED FILL — Thrombin For Soln 20000 Unit: CUTANEOUS | Qty: 1 | Status: AC

## 2014-12-26 MED FILL — Sodium Chloride IV Soln 0.9%: INTRAVENOUS | Qty: 2000 | Status: AC

## 2014-12-26 NOTE — Evaluation (Signed)
Physical Therapy Evaluation Patient Details Name: Devin MillersDarius P Langham MRN: [id number] DOB: 1966-04-28 Today's Date: 12/26/2014   History of Present Illness  Pt is a 48 y.o. male s/p L3-L4 decompression due to radiculopathy.  Clinical Impression  Patient is s/p above surgery resulting in the deficits listed below (see PT Problem List). Patient will benefit from skilled PT to increase their independence and safety with mobility (while adhering to their precautions) to allow discharge to the venue listed below. Pt limited by pain.      Follow Up Recommendations No PT follow up;Supervision/Assistance - 24 hour    Equipment Recommendations  Rolling walker with 5" wheels;3in1 (PT)    Recommendations for Other Services OT consult     Precautions / Restrictions Precautions Precautions: Back;Fall Precaution Booklet Issued: Yes (comment) Precaution Comments: reviewed handout and precautions with pt and wife Required Braces or Orthoses: Spinal Brace Spinal Brace: Lumbar corset;Applied in sitting position Restrictions Weight Bearing Restrictions: No      Mobility  Bed Mobility Overal bed mobility: Needs Assistance Bed Mobility: Rolling;Sidelying to Sit Rolling: Min assist Sidelying to sit: Min assist       General bed mobility comments: cues and (A) to perform log rolling technique; handheld (A) to elevate trunk to sitting position   Transfers Overall transfer level: Needs assistance Equipment used: Rolling walker (2 wheeled) Transfers: Sit to/from Stand Sit to Stand: Min assist         General transfer comment: (A) to power up and balance; cues for RW safety  Ambulation/Gait Ambulation/Gait assistance: Min guard Ambulation Distance (Feet): 8 Feet Assistive device: Rolling walker (2 wheeled) Gait Pattern/deviations: Step-through pattern;Decreased stride length;Shuffle Gait velocity: decreased Gait velocity interpretation: Below normal speed for age/gender General Gait  Details: cues for RW management and min guard to steady; limited by pain  Stairs            Wheelchair Mobility    Modified Rankin (Stroke Patients Only)       Balance Overall balance assessment: Needs assistance Sitting-balance support: Feet supported;No upper extremity supported Sitting balance-Leahy Scale: Fair Sitting balance - Comments: sat EOB for ~5 min and denies dizziness   Standing balance support: During functional activity;Bilateral upper extremity supported Standing balance-Leahy Scale: Poor Standing balance comment: RW to balance                              Pertinent Vitals/Pain Pain Assessment: 0-10 Pain Score: 10-Worst pain ever Pain Location: surgical site Pain Descriptors / Indicators: Sore;Discomfort Pain Intervention(s): Monitored during session;Premedicated before session;Repositioned    Home Living Family/patient expects to be discharged to:: Private residence Living Arrangements: Spouse/significant other Available Help at Discharge: Family;Available 24 hours/day Type of Home: House Home Access: Stairs to enter Entrance Stairs-Rails: None Entrance Stairs-Number of Steps: 3 Home Layout: Able to live on main level with bedroom/bathroom;Two level Home Equipment: None Additional Comments: pt reports he can walk around back and not have STE; can stay on ground floor in twin size bed    Prior Function Level of Independence: Independent               Hand Dominance        Extremity/Trunk Assessment   Upper Extremity Assessment: Defer to OT evaluation           Lower Extremity Assessment: Generalized weakness      Cervical / Trunk Assessment: Normal  Communication   Communication: No difficulties  Cognition Arousal/Alertness:  Awake/alert Behavior During Therapy: WFL for tasks assessed/performed Overall Cognitive Status: Within Functional Limits for tasks assessed                      General Comments  General comments (skin integrity, edema, etc.): encouraged OOB activity as tolerated    Exercises General Exercises - Lower Extremity Ankle Circles/Pumps: AROM;Both;10 reps;Seated      Assessment/Plan    PT Assessment Patient needs continued PT services  PT Diagnosis Difficulty walking;Generalized weakness;Acute pain   PT Problem List Decreased strength;Decreased activity tolerance;Decreased balance;Decreased mobility;Decreased knowledge of precautions;Decreased knowledge of use of DME;Pain  PT Treatment Interventions DME instruction;Gait training;Stair training;Functional mobility training;Therapeutic activities;Therapeutic exercise;Balance training;Neuromuscular re-education;Patient/family education   PT Goals (Current goals can be found in the Care Plan section) Acute Rehab PT Goals Patient Stated Goal: to go home this weekend PT Goal Formulation: With patient Time For Goal Achievement: 12/30/14 Potential to Achieve Goals: Good    Frequency Min 5X/week   Barriers to discharge        Co-evaluation               End of Session Equipment Utilized During Treatment: Gait belt;Back brace Activity Tolerance: Patient limited by pain Patient left: in chair;with call bell/phone within reach;with family/visitor present Nurse Communication: Mobility status;Precautions         Time: [id number] PT Time Calculation (min) (ACUTE ONLY): 22 min   Charges:   PT Evaluation $Initial PT Evaluation Tier I: 1 Procedure PT Treatments $Gait Training: 8-22 mins   PT G CodesDonell Sievert:        Adeleine Pask N, [place]  [phone] 12/26/2014, 2:26 PM

## 2014-12-26 NOTE — Op Note (Signed)
NAMOkey Dupre:  Gilmore, Devin            ACCOUNT NO.:  [id number]  MEDICAL RECORD NO.:  [id number]  LOCATION:  [id number]                        FACILITY:  [facility]  PHYSICIAN:  Devin BambergMark Shraddha Lebron, MD      DATE OF BIRTH:  January 04, 1966  DATE OF PROCEDURE:  12/25/2014                              OPERATIVE REPORT   PREOPERATIVE DIAGNOSIS: 1. Right L3 radiculopathy. 2. Right-sided L3-4 foraminal/extraforaminal disk herniation, status     post a previous L3-4 decompression.  POSTOPERATIVE DIAGNOSIS: 1. Right L3 radiculopathy. 2. Right-sided L3-4 foraminal/extraforaminal disk herniation, status     post a previous L3-4 decompression.  PROCEDURE: 1. Revision decompression, L3-4 (requiring more bone and disc removal  than what is required for the interbody fusion). 2. Right-sided L3-4 transforaminal lumbar interbody fusion. 3. Left-sided L3-4 posterolateral fusion. 4. Insertion of interbody device x1 (9 x 27 mm intervertebral spacer). 5. Placement posterior instrumentation L3, L4, (5  mm cortical     screws). 6. Use of local autograft. 7. Use of morselized allograft (DBX mix). 8. Intraoperative use of fluoroscopy.  SURGEON:  Devin BambergMark Jamian Andujo, MD  ASSISTANT:  Devin CoopKayla McKenzie, PA-C  ANESTHESIA:  General endotracheal anesthesia.  COMPLICATIONS:  None.  DISPOSITION:  Stable.  ESTIMATED .BLOOD LOSS:  100 mL.  INDICATIONS FOR SURGERY:  Briefly, Devin Gilmore is very pleasant 48- year-old male who did initially present me with severe pain in his left leg.  He was status post a work injury.  Of note, the patient is status post a previous L3-4 decompression 5 years ago.  He initially did well from that surgery; however, since his work injury, he was having severe pain at the anterior aspect of his leg on the right side.  An MRI did reveal nerve compression of the L3 nerve in the foraminal and extraforaminal region.  This was the region of his previous decompression.  We did go forward with extensive forms  of conservative care, and the patient did continue to have ongoing pain.  We therefore did discuss proceeding with the procedure noted above.  The patient did fully understand the risks and limitations of the procedure as outlined in my preoperative note.  OPERATIVE DETAILS:  On December 25, 2014, the patient brought to surgery and general endotracheal anesthesia was administered.  The patient was placed prone on a well-padded flat Jackson bed with a spinal frame. Antibiotics were given.  The back was prepped and draped and a time-out was performed.  Midline incision was made, overlying the patient's previous incision.  The fascia was incised at the midline.  I then obtained a lateral intraoperative radiograph to confirm the appropriate operative levels.  I then identified the L2-3 and L3-4 facet joints. Using anatomical landmarks, I did prepare entry holes for the cortical screws bilaterally at L3 and at L4.  A 2.7 mm drill was used to prepare the holes for the cortical screws.  I then used a 5 mm tap.  AP and lateral fluoroscopy was liberally used to ensure appropriate trajectory of the tap.  A ball-tip probe was used to confirm that there was no cortical violation.  Bone wax was then placed in the cannulated holes. I then decorticated the L3-4 facet joint bilaterally.  On the left side, the L3-4 facet joint was liberally decorticated.  On the right side, the L3-4 facet joint was removed in its entirety.  I then performed a bilateral lateral recess decompression.  I then was able to perform a thorough foraminal and extraforaminal decompression on the right side by removing these disk herniation which was clearly noted to be compressing the exiting L3 nerve.  I then performed a thorough and complete intervertebral diskectomy.  I did use a lamina spreader for distraction across the intervertebral space.  I then used a series of curettes and paddle scrapers to perform a thorough and  complete diskectomy.  I then liberally packed the intervertebral space with autograft obtained from the decompression, again with allograft in the form of the DBX mix.  I then placed a series of interbody trials.  I did feel that a 9 x 27 mm intervertebral spacer would be the most appropriate fit.  Once the intervertebral space was liberally packed the bone graft, the intervertebral spacer was then packed and tamped into position in the usual fashion.  I was very pleased with the press fit of the implant.  I then packed bone graft in the posterolateral gutter and along the L3-4 facet joint on the left side to help aid in the success of the fusion. The 5 x 35 mm cortical screws were placed bilaterally at L3, and on the left at L4.  A 5 x 40 mm screw was placed on the right at L4.  I did use triggered EMG to test each of the screws, and there was no screw that tested below 20 milliamps.  A 45 mm rod was then secured to the heads of the screws.  Caps were placed and a final locking procedure was performed.  Of note, there was no abnormal EMG activity noted throughout the entire surgery.  Of note, the wound was copiously irrigated with approximately 3 L of normal saline prior to placing the intervertebral bone graft.  I was very pleased with the AP and lateral fluoroscopic images.  At this point, the fascia was closed with 1-0 Vicryl.  The subcutaneous layer was closed with 0 Vicryl, followed by 2-0 Vicryl, and the skin was closed using 3-0 Monocryl.  Benzoin and Steri-Strips were applied followed by sterile dressing.  All instrument counts were correct at the termination of the procedure.  Of note, Devin Gilmore was my assistant throughout surgery, and did aid in retraction, suctioning, and closure.     Devin BambergMark Dallon Dacosta, MD     MD/MEDQ  D:  12/25/2014  T:  12/26/2014  Job:  [id number]

## 2014-12-26 NOTE — Evaluation (Signed)
Occupational Therapy Evaluation Patient Details Name: Devin Gilmore MRN: [id number] DOB: [birth year] Today's Date: 12/26/2014    History of Present Illness Pt is a 48 y.o. male s/p L3-L4 decompression due to radiculopathy.   Clinical Impression   Pt was admitted for the above.  He has back precautions and has a TLSO for OOB.  Pt will benefit from one more session of OT in acute to reinforce education and practice shower transfer. Goals in acute are for min guard level.      Follow Up Recommendations  No OT follow up;Supervision/Assistance - 24 hour    Equipment Recommendations  3 in 1 bedside comode    Recommendations for Other Services       Precautions / Restrictions Precautions Precautions: Back;Fall Precaution Booklet Issued: Yes (comment) Precaution Comments: reviewed handout and precautions with pt and wife Required Braces or Orthoses: Spinal Brace Spinal Brace: Lumbar corset;Applied in sitting position Restrictions Weight Bearing Restrictions: No      Mobility Bed Mobility Overal bed mobility: Needs Assistance Bed Mobility: Rolling;Sidelying to Sit Rolling: Min assist Sidelying to sit: Mod assist       General bed mobility comments: used pad to assist pt onto R side.  Assist for trunk to sit up and assist for legs lying down  Transfers Overall transfer level: Needs assistance Equipment used: Rolling walker (2 wheeled) Transfers: Sit to/from Stand Sit to Stand: Min assist         General transfer comment: (A) to power up and balance; cues for RW safety    Balance Overall balance assessment: Needs assistance Sitting-balance support: Feet supported;No upper extremity supported Sitting balance-Leahy Scale: Fair                                  ADL Overall ADL's : Needs assistance/impaired     Grooming: Set up;Sitting   Upper Body Bathing: Set up;Sitting   Lower Body Bathing: Maximal assistance;Sit to/from stand   Upper Body  Dressing : Minimal assistance;Sitting   Lower Body Dressing: Total assistance;Sit to/from stand   Toilet Transfer: Moderate assistance;Ambulation;BSC   Toileting- Clothing Manipulation and Hygiene: Maximal assistance;Sit to/from stand         General ADL Comments: pt would benefit from AE:  especially toilet aide, reacher, and long sponge.  Family can assist with socks.  Reinforced back precautions in alternative reports.  Family donned TLSO. When pt sat on 3:1 in bathroom, he was starting to sweat, but stated he felt OK:  Did not practice shower transfer this session     Vision                     Perception     Praxis      Pertinent Vitals/Pain Pain Assessment: 0-10 Pain Score: 7  Pain Location: back Pain Descriptors / Indicators: Sore Pain Intervention(s): Limited activity within patient's tolerance;Monitored during session;Repositioned     Hand Dominance     Extremity/Trunk Assessment Upper Extremity Assessment Upper Extremity Assessment: Overall WFL for tasks assessed      Cervical / Trunk Assessment Cervical / Trunk Assessment: Normal   Communication Communication Communication: No difficulties   Cognition Arousal/Alertness: Awake/alert Behavior During Therapy: WFL for tasks assessed/performed Overall Cognitive Status: Within Functional Limits for tasks assessed                     General Comments  Exercises      Shoulder Instructions      Home Living Family/patient expects to be discharged to:: Private residence Living Arrangements: Spouse/significant other Available Help at Discharge: Family;Available 24 hours/day Type of Home: House Home Access: Stairs to enter Entergy CorporationEntrance Stairs-Number of Steps: 3 Entrance Stairs-Rails: None Home Layout: Able to live on main level with bedroom/bathroom;Two level Alternate Level Stairs-Number of Steps: 7   Bathroom Shower/Tub: Producer, television/film/videoWalk-in shower   Bathroom Toilet: Standard     Home  Equipment: None   Additional Comments: can stay on ground floor; has family help--alone for 1-2 hour periods      Prior Functioning/Environment Level of Independence: Independent             OT Diagnosis: Acute pain;Generalized weakness   OT Problem List: Decreased strength;Decreased activity tolerance;Decreased knowledge of use of DME or AE;Decreased knowledge of precautions;Pain   OT Treatment/Interventions: Self-care/ADL training;DME and/or AE instruction;Balance training;Patient/family education    OT Goals(Current goals can be found in the care plan section) Acute Rehab OT Goals Patient Stated Goal: to go home this weekend OT Goal Formulation: With patient Time For Goal Achievement: 01/02/15 Potential to Achieve Goals: Good ADL Goals Pt Will Transfer to Toilet: with min guard assist;bedside commode;ambulating Pt Will Perform Tub/Shower Transfer: Shower transfer;with min guard assist;ambulating;3 in 1 Additional ADL Goal #1: pt will use reacher to don pants/retrieve item from floor with supervision  OT Frequency: Min 2X/week   Barriers to D/C:            Co-evaluation              End of Session    Activity Tolerance: Patient tolerated treatment well Patient left: in bed;with call bell/phone within reach;with family/visitor present   Time: [id number] OT Time Calculation (min): 30 min Charges:  OT General Charges $OT Visit: 1 Procedure OT Evaluation $Initial OT Evaluation Tier I: 1 Procedure OT Treatments $Self Care/Home Management : 23-37 mins G-Codes:    Vada Swift 12/26/2014, 4:07 PM Marica OtterMaryellen Ednah Hammock, OTR/L [phone] 12/26/2014

## 2014-12-26 NOTE — Progress Notes (Signed)
Workman's Comp. Case Manager requested that the wife take the prescriptions today to have them filled, with tomorrow 12/27/14 being a holiday. The patient and wife understand that these are the his only scripts and there will not be new scripts printed when he is discharged.

## 2014-12-26 NOTE — Progress Notes (Signed)
12/26/14 Spoke with patient and then spoke with Workers Comp CM Qwest CommunicationsPatty Harper in person. Gave her copies of the orders for HHOT, rolling walker and 3N1. She stated that [facility] walker and 3N1 delivered to patient's home on 12/27/14. She stated that she will obtain authorizaton for patient's d/c rxs. Jacquelynn CreeMary Asti Mackley RN, BSN, CCM

## 2014-12-26 NOTE — Progress Notes (Signed)
Discharge summary sent to payer through [employer]  

## 2014-12-26 NOTE — Progress Notes (Signed)
OT Cancellation Note  Patient Details Name: Linard MillersDarius P Picinich MRN: [id number] DOB: 13-Feb-1966   Cancelled Treatment:    Reason Eval/Treat Not Completed: Other (comment).  Pt just got back to bed.  Will check back later if schedule permits.  If not, OT will reattempt in the am.  Georgiann Neider 12/26/2014, 1:50 PM  Marica OtterMaryellen Anora Schwenke, OTR/L [phone] 12/26/2014

## 2014-12-26 NOTE — Progress Notes (Signed)
Patient doing well. Minimal back pain. No leg pain. C/o intermittent muscle spasms in low back  BP 118/61 mmHg  Pulse 94  Temp(Src) 99.1 F (37.3 C) (Oral)  Resp 11  Wt 100.245 kg (221 lb)  SpO2 99%  NVI Dressing CDI  S/p L3/4 decompression and fusion, doing very well  - up with PT/OT today - back precautions at all times - likely d/c home tomorrow - TLSO brace when out of bed

## 2014-12-27 NOTE — Progress Notes (Signed)
12/27/2014  Pt and wife would like one more session of PT prior to d/c home.  Wife is concerned that she cannot handle him in his current state of pain and spasm.  They have 3 steps without rails to enter the home and 6 steps with one rail to get to his bedroom.  I had wife participate fully in session today and she would like to do this one more time in AM before pt goes.  He continues to have significant pain and spasm in his low back with mobility.  PT agrees with this plan and RN made aware.  She is going to contact MD.  PT to see first thing in AM tomorrow.  Detailed treatment note to follow.  Thanks,  Rollene Rotunda. Jaedin Trumbo, PT, DPT [phone]

## 2014-12-27 NOTE — Progress Notes (Signed)
OT Cancellation Note  Patient Details Name: Devin Gilmore MRN: [id number] DOB: 1966/10/09   Cancelled Treatment:    Reason Eval/Treat Not Completed: Fatigue/lethargy limiting ability to participate. Pt just returned to bed after PT. Will reattempt later today as schedule allows or see tomorrow.  Evern Bio 12/27/2014, 2:33 PM

## 2014-12-27 NOTE — Progress Notes (Signed)
Physical Therapy Treatment Patient Details Name: Devin Gilmore MRN: [id number] DOB: [birth year] Today's Date: 12/27/2014    History of Present Illness 49 y.o. male admitted to [facility] on 12/25/14 for elective L3-4 revision of decompression and fusion.  Pt with significant PMHx of back surgery in 2010.      PT Comments    Pt continues to progress, but slowly, due to significant pain and spasm with mobility.  He was able to walk to the stairwell and initiate stair training today with mod assist.  Wife reports because ground is so wet, they will no longer be able to go to the back of the house to enter their home on a level entry and will have to do the 3 STE with no railing.  Pt is very slow, guarded, and effortful with all mobility.  Today is the first day that wife has been educated and hands on in his care and his mobility.  Both pt and wife would benefit from one more day of PT reinforcement, so I am recommending he stay the night, get one more session of PT in AM before d/c home with wife's assist.  RN made aware and is going to page MD.    Follow Up Recommendations  No PT follow up;Supervision/Assistance - 24 hour     Equipment Recommendations  Rolling walker with 5" wheels;3in1 (PT)    Recommendations for Other Services   NA     Precautions / Restrictions Precautions Precautions: Back;Fall Precaution Booklet Issued: Yes (comment) Precaution Comments: reviewed handout and precautions with pt and wife Required Braces or Orthoses: Spinal Brace Spinal Brace: Lumbar corset;Applied in sitting position    Mobility  Bed Mobility Overal bed mobility: Needs Assistance Bed Mobility: Rolling;Sidelying to Sit;Sit to Sidelying Rolling: Min guard Sidelying to sit: Min guard     Sit to sidelying: Min assist General bed mobility comments: Min guard assist to help support and position legs during transitional movements.  Practiced log roll and reverse log roll techniques to get up and down  out of bed. Wife assisting pt second time we practiced.   Transfers Overall transfer level: Needs assistance Equipment used: Rolling walker (2 wheeled) Transfers: Sit to/from Stand Sit to Stand: Min assist         General transfer comment: Min assist to support trunk during transition to stand as pt come up over very flexed legs and tends to sink to the floor before raising up over extended knees and arms.  Verbal cues for safe hand placement and very slow and effortful transitions.   Ambulation/Gait Ambulation/Gait assistance: Min assist Ambulation Distance (Feet): 75 Feet Assistive device: Rolling walker (2 wheeled) Gait Pattern/deviations: Step-through pattern;Shuffle;Trunk flexed (knees flexed) Gait velocity: decreased Gait velocity interpretation: <1.8 ft/sec, indicative of risk for recurrent falls General Gait Details: Pt with very slow and effortful gait pattern.  Flexed at trunk and hips.  Verbal and manual cues for upright posture.  He is relying heavily on hands for support during gait and does at times get spasms into his buttocks which make him soft at the knees.     Stairs Stairs: Yes Stairs assistance: Mod assist Stair Management: One rail Right;Step to pattern;Forwards;Sideways Number of Stairs: 6 (3x2) General stair comments: Practiced one rail and hand held assist just to get pt used to feeling what ascending and descending the stairs will feel like for home entry.  I think we are planning to have him have his wife on one side and her sister  on the other side to get into the house as he does not have any rails for the 3 STE.  We also practiced sideways with one rail simulating getting to his bedroom.  Mod assist for both methods to support trunk during transitions up and down the stairs.  Pt doesn't have a good or bad leg, so ephasized step to pattern.           Balance Overall balance assessment: Needs assistance Sitting-balance support: Feet supported;No upper  extremity supported Sitting balance-Leahy Scale: Fair     Standing balance support: Bilateral upper extremity supported Standing balance-Leahy Scale: Poor                      Cognition Arousal/Alertness: Awake/alert Behavior During Therapy: WFL for tasks assessed/performed Overall Cognitive Status: Within Functional Limits for tasks assessed (wife reports he gets very "fuzzy" with meds)                         General Comments General comments (skin integrity, edema, etc.): Wife helped pt donn and doff brace seated EOB.  Verbal cues for correct placement and keeping one side velcroed.  Wife also participating in assisting pt with bed mobility.  Reinforced all back education, lifting restrictions, sitting restrictions, and exercise progression with both pt and his wife.        Pertinent Vitals/Pain Pain Assessment: 0-10 Pain Score: 8  Pain Location: back, bil buttocks Pain Descriptors / Indicators: Aching;Burning Pain Intervention(s): Limited activity within patient's tolerance;Monitored during session;Repositioned;Patient requesting pain meds-RN notified    Home Living                      Prior Function            PT Goals (current goals can now be found in the care plan section) Acute Rehab PT Goals Patient Stated Goal: to go home this weekend Progress towards PT goals: Progressing toward goals    Frequency  Min 5X/week    PT Plan Current plan remains appropriate       End of Session Equipment Utilized During Treatment: Back brace Activity Tolerance: Patient limited by fatigue;Patient limited by pain Patient left: in bed;with call bell/phone within reach;with family/visitor present     Time: 4098-1191 PT Time Calculation (min) (ACUTE ONLY): 39 min  Charges:  $Gait Training: 8-22 mins $Therapeutic Activity: 8-22 mins $Self Care/Home Management: 8-22                      Alyshia Kernan B. Cola Gane, PT, DPT [phone]   12/27/2014, 2:15  PM

## 2014-12-27 NOTE — Discharge Summary (Signed)
Patient ID: Devin Gilmore MRN: [id number] DOB/AGE: 04-20-1966 49 y.o.  Admit date: 12/25/2014 Discharge date: 12/27/2014  Admission Diagnoses:  Active Problems:   Radiculopathy   Discharge Diagnoses:  Same  Past Medical History  Diagnosis Date  . Heart murmur     echo 05 no problems    Surgeries: Procedure(s): POSTERIOR LUMBAR FUSION 1 LEVEL on 12/25/2014   Consultants:    Discharged Condition: Improved  Hospital Course: Jeb ENDRIT GITTINS is an 49 y.o. male who was admitted 12/25/2014 for operative treatment of<principal problem not specified>. Patient has severe unremitting pain that affects sleep, daily activities, and work/hobbies. After pre-op clearance the patient was taken to the operating room on 12/25/2014 and underwent  Procedure(s): POSTERIOR LUMBAR FUSION 1 LEVEL.    Patient was given perioperative antibiotics: Anti-infectives    Start     Dose/Rate Route Frequency Ordered Stop   12/25/14 2200  ceFAZolin (ANCEF) IVPB 1 g/50 mL premix     1 g100 mL/hr over 30 Minutes Intravenous Every 8 hours 12/25/14 1621 12/26/14 0623   12/25/14 0600  ceFAZolin (ANCEF) IVPB 2 g/50 mL premix     2 g100 mL/hr over 30 Minutes Intravenous On call to O.R. 12/24/14 1357 12/25/14 1531       Patient was given sequential compression devices, early ambulation, and chemoprophylaxis to prevent DVT.  Patient benefited maximally from hospital stay and there were no complications.    Recent vital signs: Patient Vitals for the past 24 hrs:  BP Temp Temp src Pulse Resp SpO2  12/27/14 0539 (!) 128/53 mmHg 98.7 F (37.1 C) Oral 88 16 98 %  12/26/14 2006 139/70 mmHg 99.1 F (37.3 C) Oral 94 17 97 %  12/26/14 1344 (!) 168/82 mmHg 99 F (37.2 C) - 87 15 98 %     Recent laboratory studies: No results for input(s): WBC, HGB, HCT, PLT, NA, K, CL, CO2, BUN, CREATININE, GLUCOSE, INR, CALCIUM in the last 72 hours.  Invalid input(s): PT, 2   Discharge Medications:     Medication List     TAKE these medications        gabapentin 300 MG capsule  Commonly known as:  NEURONTIN  Take 300 mg by mouth 3 (three) times daily.        Diagnostic Studies: Dg Chest 2 View  12/16/2014   CLINICAL DATA:  Preoperative exam prior to posterior lumbar fusion; history of heart murmur; nonsmoker  EXAM: CHEST  2 VIEW  COMPARISON:  PA and lateral chest x-ray of October 31, 2009.  FINDINGS: The lungs are adequately inflated and clear. The heart and pulmonary vascularity are normal. The trachea is midline. There is no pleural effusion or pneumothorax. The bony thorax is unremarkable.  IMPRESSION: Normal chest x-ray.   Electronically Signed   By: David  [place]   On: 12/16/2014 09:59   Dg Lumbar Spine 2-3 Views  12/25/2014   CLINICAL DATA:  Revision lumbar 3-4 decompression.  EXAM: LUMBAR SPINE - 2-3 VIEW  COMPARISON:  None.  FINDINGS: On the first image, the radiopaque marker is identified at L2. A second radiopaque marker is identified at L4. On the second image, a clamp is identified at L3.  IMPRESSION: Intraoperative cross-table lateral lumbar film as described.   Electronically Signed   By: Sherian Rein M.D.   On: 12/25/2014 15:58   Dg Lumbar Spine 2-3 Views  12/25/2014   CLINICAL DATA:  Revision of L3 for lumbar decompression with right-sided lumbar trans foraminal interbody fusion ;  fluoro time reported as 30 seconds  EXAM: LUMBAR SPINE - 2-3 VIEW  COMPARISON:  Localization films of today's date.  FINDINGS: Two spot films are submitted. The patient has undergone inter discal device placement at L3-4 and posterior fusion. Radiographic positioning of the prosthetic components is good.  IMPRESSION: The patient has undergone posterior fusion and interdiscal device placement at L3-4 without evidence of immediate postprocedure complication.   Electronically Signed   By: David  [place]   On: 12/25/2014 15:50   Dg C-arm 61-120 Min  12/25/2014   CLINICAL DATA:  Revision of L3-4 decompression  EXAM: DG  C-ARM 61-120 MIN  FLUOROSCOPY TIME:  30 seconds  COMPARISON:  None  FINDINGS: C-arm fluoroscopy was provided during posterior fusion at L3-4.  IMPRESSION: C-arm fluoroscopy provided.   Electronically Signed   By: Dwyane Dee M.D.   On: 12/25/2014 15:47    Disposition:       Discharge Instructions    Call MD / Call 911    Complete by:  As directed   If you experience chest pain or shortness of breath, CALL 911 and be transported to the hospital emergency room.  If you develope a fever above 101 F, pus (white drainage) or increased drainage or redness at the wound, or calf pain, call your surgeon's office.     Constipation Prevention    Complete by:  As directed   Drink plenty of fluids.  Prune juice may be helpful.  You may use a stool softener, such as Colace (over the counter) 100 mg twice a day.  Use MiraLax (over the counter) for constipation as needed.     Diet - low sodium heart healthy    Complete by:  As directed      Discharge instructions    Complete by:  As directed   Follow up with Dr. Yevette Edwards 2 weeks Post Op.     Driving restrictions    Complete by:  As directed   No driving for 2 weeks     Increase activity slowly as tolerated    Complete by:  As directed      Lifting restrictions    Complete by:  As directed   No lifting           Follow-up Information    Follow up with Emilee Hero, MD.   Specialty:  Orthopedic Surgery   Contact information:   [address] [place] [address] [phone]        Signed: Vear Clock, Athene Schuhmacher R 12/27/2014, 9:57 AM

## 2014-12-27 NOTE — Progress Notes (Signed)
PATIENT ID: Devin Gilmore  MRN: [id number]  DOB/AGE:  [birth year] / 49 y.o.  2 Days Post-Op Procedure(s) (LRB): POSTERIOR LUMBAR FUSION 1 LEVEL (Right)    PROGRESS NOTE Subjective:   Patient is alert, oriented, no Nausea, no Vomiting, no passing gas, no Bowel Movement. Taking PO well. Denies SOB, Chest or Calf Pain. Using Incentive Spirometer, PAS in place. Ambulate WBAT with pt up with therapy yesterday, Patient reports pain as 4 on 0-10 scale,     Objective: Vital signs in last 24 hours: Temp:  [98.7 F (37.1 C)-99.1 F (37.3 C)] 98.7 F (37.1 C) (01/01 0539) Pulse Rate:  [87-94] 88 (01/01 0539) Resp:  [15-17] 16 (01/01 0539) BP: (128-168)/(53-82) 128/53 mmHg (01/01 0539) SpO2:  [97 %-98 %] 98 % (01/01 0539)    Intake/Output from previous day: I/O last 3 completed shifts: In: 1640 [P.O.:960; I.V.:680] Out: 1000 [Urine:1000]   Intake/Output this shift: Total I/O In: 360 [P.O.:360] Out: -    LABORATORY DATA: No results for input(s): WBC, HGB, HCT, PLT, NA, K, CL, CO2, BUN, CREATININE, GLUCOSE, GLUCAP, INR, CALCIUM in the last 72 hours.  Invalid input(s): PT, 2  Examination: Neurologically intact Neurovascular intact Sensation intact distally Intact pulses distally Dorsiflexion/Plantar flexion intact Incision: dressing C/D/I No cellulitis present Compartment soft}  Assessment:   2 Days Post-Op Procedure(s) (LRB): POSTERIOR LUMBAR FUSION 1 LEVEL (Right) ADDITIONAL DIAGNOSIS:    Plan:  Weight Bearing as Tolerated (WBAT)  DVT Prophylaxis:  Foot Pumps  DISCHARGE PLAN: Home  DISCHARGE NEEDS: Walker   - up with PT/OT today - back precautions at all times - likely d/c home today pending meeting therapy goals. - TLSO brace when out of bed     PHILLIPS, ERIC R 12/27/2014, 9:51 AM

## 2014-12-28 NOTE — Care Management Note (Addendum)
    Page 1 of 2   12/28/2014     2:32:56 PM CARE MANAGEMENT NOTE 12/28/2014  Patient:  Devin Gilmore, Devin Gilmore   Account Number:  [id number]  Date Initiated:  12/26/2014  Documentation initiated by:  [facility]  Subjective/Objective Assessment:   s/Gilmore L3-4 TLIF     Action/Plan:   PT/OT evals  recommended rolling walker and 3N1   Anticipated DC Date:  12/27/2014   Anticipated DC Plan:  HOME W HOME HEALTH SERVICES      DC Planning Services  CM consult      Choice offered to / List presented to:             Status of service:  Completed, signed off Medicare Important Message given?   (If response is "NO", the following Medicare IM given date fields will be blank) Date Medicare IM given:   Medicare IM given by:   Date Additional Medicare IM given:   Additional Medicare IM given by:    Discharge Disposition:  HOME W HOME HEALTH SERVICES  Per UR Regulation:  Reviewed for med. necessity/level of care/duration of stay  If discussed at Long Length of Stay Meetings, dates discussed:    Comments:  12/28/13 14:30 CM received call from [facility] rep, [place] who states DME will be delivered to room today.  CM called unit secretary to leave message for RN DME will be delivered. CM called wife of pt to notify her of DME delivery and to pleASE FOLLOW UP WITH pt's case mgr, Ms. Hopper on Monday for HHOT status.  No other CM needs were commumicated. Freddy Jaksch, BSN, Caryl Ada [phone].  12/28/13 13:00 CM received call from RN stating pt is ready for discharge and he does not have his equipment.  CM called the pt and requested his case mgrs contact information: Murtis Sink [phone].  CM called ms Hopper who states she tried to set this up on 12/31 with DME rep from [employer], Riki Rusk.  Ms Alwyn Ren states Riki Rusk was trying to work with [facility] but we were not cooperative.  CM explained to Ms Alwyn Ren, we cannot choose DME providers or [facility] agencies for Workers Comp cases. Ms hopper stated she  has worked for [employer] and would we just "go into the closet on and get him his DME" I stated I would not be doing this and  she can arrange this with [facility] and would she like the number and she stated, "NO."   Ms Alwyn Ren gave this case mgr Jeremy's number: [phone] which was called but switchboard operator states she has no one by that name.  Ms. Alwyn Ren states she is NOT working today and just happened to answer the phone.CM called [facility] rep, [place] who, kindly, states she will look into it (CM gave [place] contact numbers of Alwyn Ren and [place]). Regardless, Referrals for both DME and HH services must be made by [employer].   CM will need to follow up Workers Comp on Monday 12/30/13. RN notified.  Freddy Jaksch, BSN, [place] Ada [phone].   12/26/14 Spoke with patient and then spoke with Workers Comp CM [employer] in person. Gave her copies of the orders for HHOT, rolling walker and 3N1. She stated that she will get authorization and set up [facility] and have rolling walker and 3N1 delivered to patient's home on 12/27/14. She stated that she will obtain authorizaton for patient's d/c rxs. Jacquelynn Cree RN, BSN, CCM

## 2014-12-28 NOTE — Progress Notes (Addendum)
Occupational Therapy Treatment Patient Details Name: Devin Gilmore MRN: [id number] DOB: Oct 04, 1966 Today's Date: 12/28/2014    History of present illness 49 y.o. male admitted to [facility] on 12/25/14 for elective L3-4 revision of decompression and fusion.  Pt with significant PMHx of back surgery in 2010.     OT comments  Pt. Progressing well with acute OT goals and ready for D/C home.  Wife present for session.  Reviewed all A/E equipment and answered questions regarding toileting and bathing.  Both feel ready for d/c home and have 24/7 A arranged.   Follow Up Recommendations  No OT follow up;Supervision/Assistance - 24 hour    Equipment Recommendations  3 in 1 bedside comode;Other (comment) (A/E kit including toilet aide) , spoke with RN regarding pt and spouse questions regarding need for all of the DME prior to d/c home   Recommendations for Other Services      Precautions / Restrictions Precautions Precautions: Back;Fall Precaution Booklet Issued: Yes (comment) Precaution Comments: reviewed handout and precautions with pt and wife Required Braces or Orthoses: Spinal Brace Spinal Brace: Thoracolumbosacral orthotic;Applied in sitting position Restrictions Weight Bearing Restrictions: No       Mobility Bed Mobility Overal bed mobility: Modified Independent Bed Mobility: Rolling;Sidelying to Sit Rolling: Modified independent (Device/Increase time) Sidelying to sit: Modified independent (Device/Increase time)       General bed mobility comments: up in chair upon arrival into room  Transfers Overall transfer level: Needs assistance Equipment used: Rolling walker (2 wheeled) Transfers: Sit to/from [facility] Sit to Stand: Min guard Stand pivot transfers: Min guard       General transfer comment: minA to off weight trunk during hand transition. pt with significant knee flexion during transition of hands    Balance           Standing balance  support: Bilateral upper extremity supported Standing balance-Leahy Scale: Poor Standing balance comment: RW for balance                   ADL Overall ADL's : Needs assistance/impaired               Lower Body Bathing Details (indicate cue type and reason): provided demo and reviewed use of A/E with pt. and spouse       Lower Body Dressing Details (indicate cue type and reason): provided demo and reviewed use of A/E with pt. and spouse Toilet Transfer: Min guard;Ambulation;RW Toilet Transfer Details (indicate cue type and reason): simulated during transfer in room, declined going to b.room   Toileting - Clothing Manipulation Details (indicate cue type and reason): discussed and reviewed at length the benefit of toilet aide and tongs, also wet toilet paper and other methods for effective peri care Tub/ Shower Transfer: Walk-in shower;Min guard;Ambulation;Anterior/posterior;3 in 1     General ADL Comments: pt would benefit from AE:  especially toilet aide, reacher, and long sponge.  Family can assist with socks.  Reinforced back precautions in alternative reports.  Family donned TLSO      Vision                     Perception     Praxis      Cognition   Behavior During Therapy: WFL for tasks assessed/performed Overall Cognitive Status: Within Functional Limits for tasks assessed                       Extremity/Trunk Assessment  Exercises     Shoulder Instructions       General Comments      Pertinent Vitals/ Pain       Pain Assessment: No/denies pain Pain Score: 6  Pain Location: back, bil buttocks Pain Intervention(s): Premedicated before session  Home Living                                          Prior Functioning/Environment              Frequency Min 2X/week     Progress Toward Goals  OT Goals(current goals can now be found in the care plan section)  Progress towards OT goals:  Progressing toward goals     Plan Discharge plan remains appropriate    Co-evaluation                 End of Session Equipment Utilized During Treatment: Rolling walker   Activity Tolerance Patient tolerated treatment well   Patient Left in chair;with call bell/phone within reach;with family/visitor present   Nurse Communication Other (comment) (spoke with nurse regarding a/e and dme needs)        Time: 7096-2836 OT Time Calculation (min): 27 min  Charges: OT General Charges $OT Visit: 1 Procedure OT Treatments $Self Care/Home Management : 23-37 mins  Janice Coffin, COTA/L 12/28/2014, 9:46 AM

## 2014-12-28 NOTE — Progress Notes (Signed)
PATIENT ID: DEMARCUS THIELKE  MRN: [id number]  DOB/AGE:  08/27/1966 / 49 y.o.  3 Days Post-Op Procedure(s) (LRB): POSTERIOR LUMBAR FUSION 1 LEVEL (Right)    PROGRESS NOTE Subjective:   Patient is alert, oriented, no Nausea, no Vomiting, yes passing gas, no Bowel Movement. Taking PO well. Denies SOB, Chest or Calf Pain. Using Incentive Spirometer, PAS in place. Ambulate WBAT, Patient reports pain as mild,     Objective: Vital signs in last 24 hours: Temp:  [98.4 F (36.9 C)-99.5 F (37.5 C)] 98.5 F (36.9 C) (01/02 0515) Pulse Rate:  [85-98] 85 (01/02 0515) Resp:  [18] 18 (01/02 0515) BP: (147-159)/(76-83) 147/83 mmHg (01/02 0515) SpO2:  [95 %-100 %] 100 % (01/02 0515)    Intake/Output from previous day: I/O last 3 completed shifts: In: 1200 [P.O.:1200] Out: -    Intake/Output this shift:     LABORATORY DATA: No results for input(s): WBC, HGB, HCT, PLT, NA, K, CL, CO2, BUN, CREATININE, GLUCOSE, GLUCAP, INR, CALCIUM in the last 72 hours.  Invalid input(s): PT, 2  Examination: Neurologically intact Neurovascular intact Sensation intact distally Intact pulses distally Dorsiflexion/Plantar flexion intact}  Assessment:   3 Days Post-Op Procedure(s) (LRB): POSTERIOR LUMBAR FUSION 1 LEVEL (Right) ADDITIONAL DIAGNOSIS:    Plan: Weight Bearing as Tolerated (WBAT)  DVT Prophylaxis: Foot Pumps  DISCHARGE PLAN: Home  DISCHARGE NEEDS: Walker   - up with PT/OT today - back precautions at all times - likely d/c home today pending meeting therapy goals. - TLSO brace when out of bed    Merilynn Haydu R 12/28/2014, 9:21 AM

## 2014-12-28 NOTE — Progress Notes (Signed)
Physical Therapy Treatment Patient Details Name: Devin Gilmore MRN: [id number] DOB: 1966/01/09 Today's Date: 12/28/2014    History of Present Illness 49 y.o. male admitted to [facility] on 12/25/14 for elective L3-4 revision of decompression and fusion.  Pt with significant PMHx of back surgery in 2010.      PT Comments    Pt with improved mobility and wife with decreased anxiety re: taking pt home. Pt requiring less assist with stair negotiation and bed mobility. Wife and patient able to don brace EOB appropriately. Pt safe to d/c home with use of RW and assist of wife once medically stable.  Follow Up Recommendations  No PT follow up;Supervision/Assistance - 24 hour     Equipment Recommendations  Rolling walker with 5" wheels;3in1 (PT)    Recommendations for Other Services       Precautions / Restrictions Precautions Precautions: Back;Fall Precaution Booklet Issued: Yes (comment) Precaution Comments: reviewed handout and precautions with pt and wife Required Braces or Orthoses: Spinal Brace Spinal Brace: Thoracolumbosacral orthotic;Applied in sitting position Restrictions Weight Bearing Restrictions: No    Mobility  Bed Mobility Overal bed mobility: Modified Independent Bed Mobility: Rolling;Sidelying to Sit Rolling: Modified independent (Device/Increase time) Sidelying to sit: Modified independent (Device/Increase time)       General bed mobility comments: increased time, able to demo good log roll technique without use of bed rail  Transfers Overall transfer level: Needs assistance Equipment used: Rolling walker (2 wheeled) Transfers: Sit to/from Stand Sit to Stand: Min assist         General transfer comment: minA to off weight trunk during hand transition. pt with significant knee flexion during transition of hands  Ambulation/Gait Ambulation/Gait assistance: Min assist Ambulation Distance (Feet): 75 Feet Assistive device: Rolling walker (2 wheeled) Gait  Pattern/deviations: Step-through pattern;Decreased stride length;Antalgic (increased bilat UE support)     General Gait Details: v/c's to relax shoulders and increase WBing through LEs not UEs   Stairs Stairs: Yes Stairs assistance: Min assist Stair Management: No rails;One rail Left Number of Stairs: 6 (3 with bilat HHA, 6 with L HR to mimic home entry ) General stair comments: pt desired to ascend/descend forwards instead of sideways. pt with step to pattenr. increased time but no episodes of LOB. Pt also practiced with spouse asc/desc with bilat HHA to mimic home entry. wife with good return demo on stair negotiation assistance  Wheelchair Mobility    Modified Rankin (Stroke Patients Only)       Balance           Standing balance support: Bilateral upper extremity supported Standing balance-Leahy Scale: Poor Standing balance comment: RW for balance                    Cognition Arousal/Alertness: Awake/alert Behavior During Therapy: WFL for tasks assessed/performed Overall Cognitive Status: Within Functional Limits for tasks assessed                      Exercises      General Comments General comments (skin integrity, edema, etc.): educated on donning brace and optimal fit at EOB and tightening with the middle strap first      Pertinent Vitals/Pain Pain Assessment: 0-10 Pain Score: 6  Pain Location: back, bil buttocks Pain Intervention(s): Monitored during session    Home Living                      Prior Function  PT Goals (current goals can now be found in the care plan section) Progress towards PT goals: Progressing toward goals    Frequency  Min 5X/week    PT Plan Current plan remains appropriate    Co-evaluation             End of Session Equipment Utilized During Treatment: Back brace Activity Tolerance: Patient tolerated treatment well Patient left: in chair;with call bell/phone within reach;with  family/visitor present     Time: 0725-0755 PT Time Calculation (min) (ACUTE ONLY): 30 min  Charges:  $Gait Training: 8-22 mins $Therapeutic Activity: 8-22 mins                    G Codes:      Marcene Brawn 12/28/2014, 8:07 AM  Lewis Shock, PT, DPT Pager #: [phone] Office #: [phone]

## 2016-05-12 IMAGING — CR DG CHEST 2V
2 series · 2 of 2 positions shown · non-contrast
Comparison: PA and lateral chest x-ray October 31, 2009.

CLINICAL DATA: Preoperative exam prior to posterior lumbar fusion;
history of heart murmur; nonsmoker

EXAM:
CHEST  2 VIEW

[w chest pa]
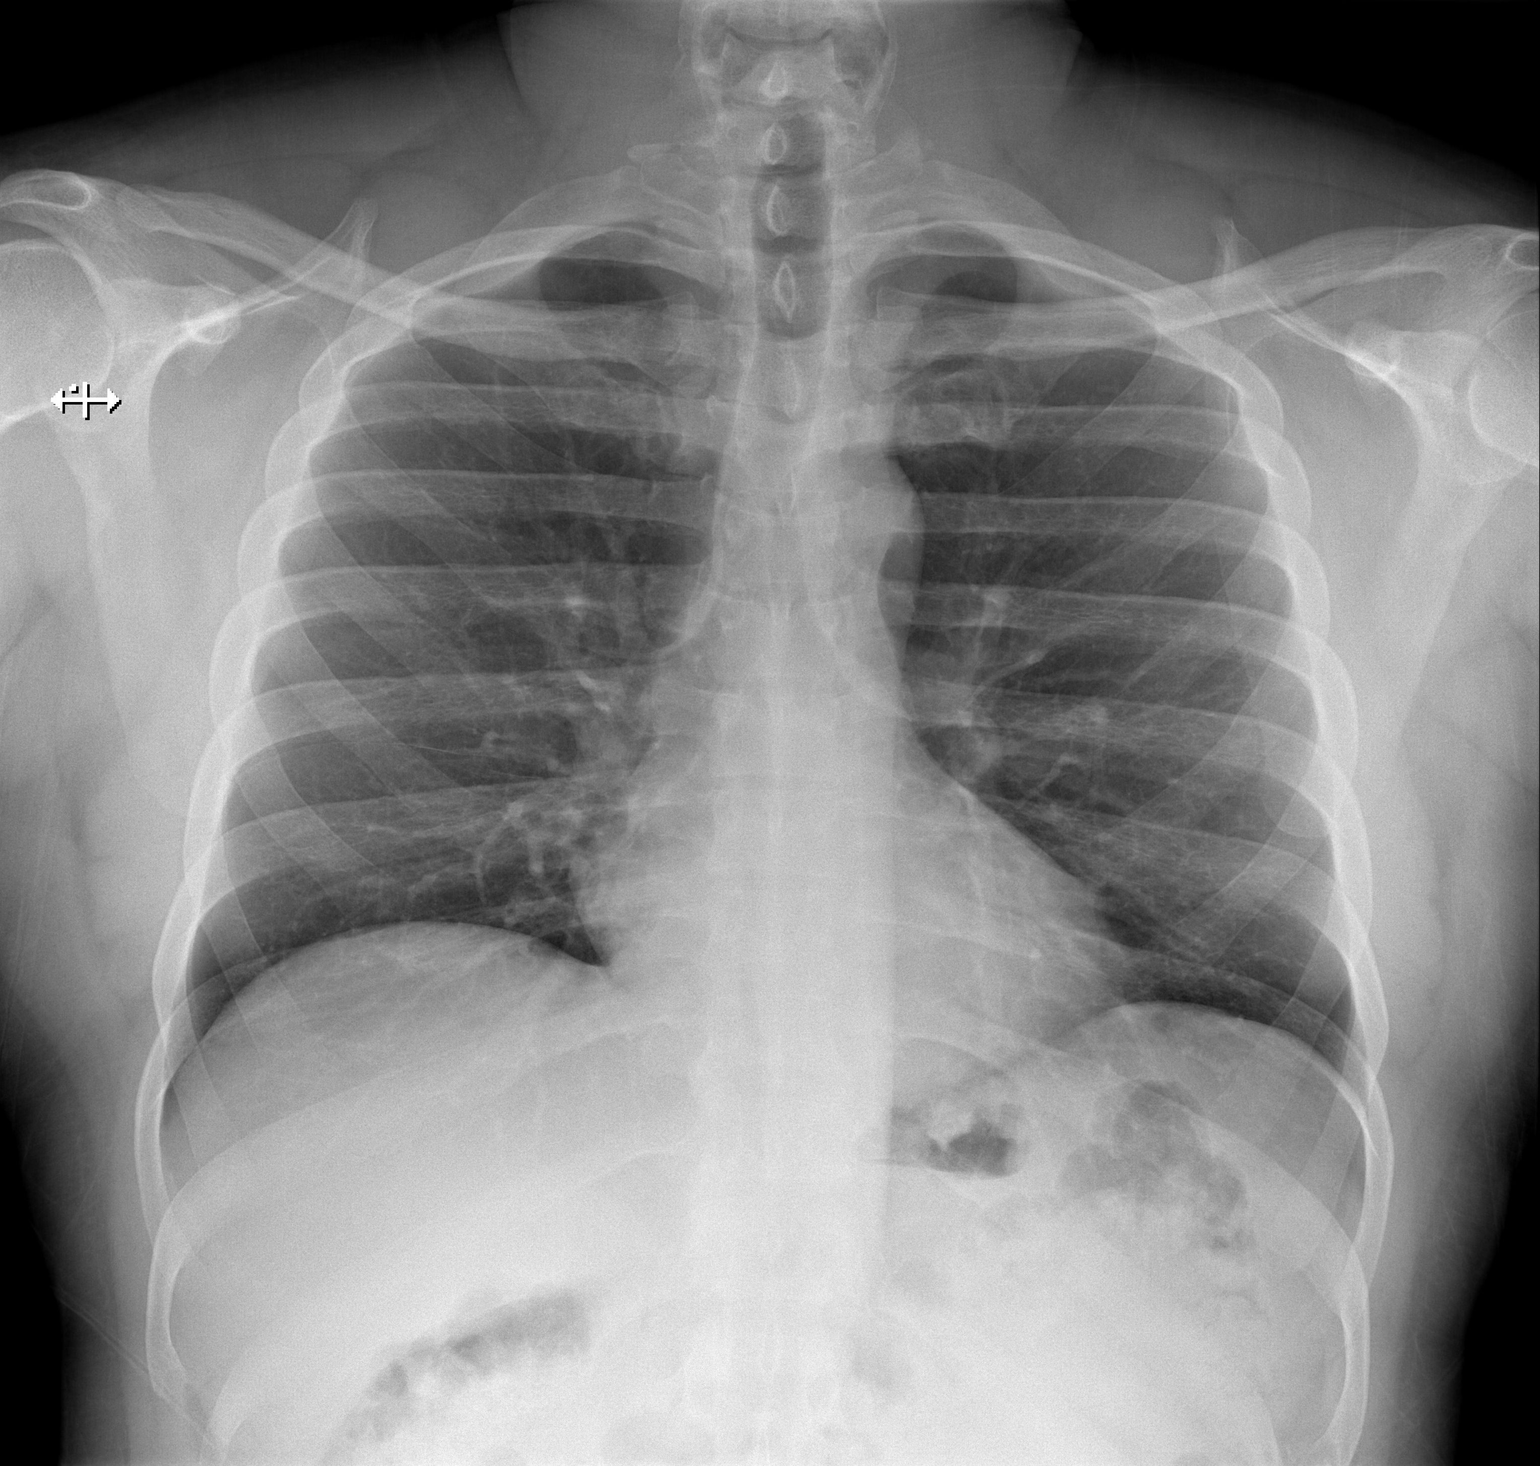

[w chest lat]
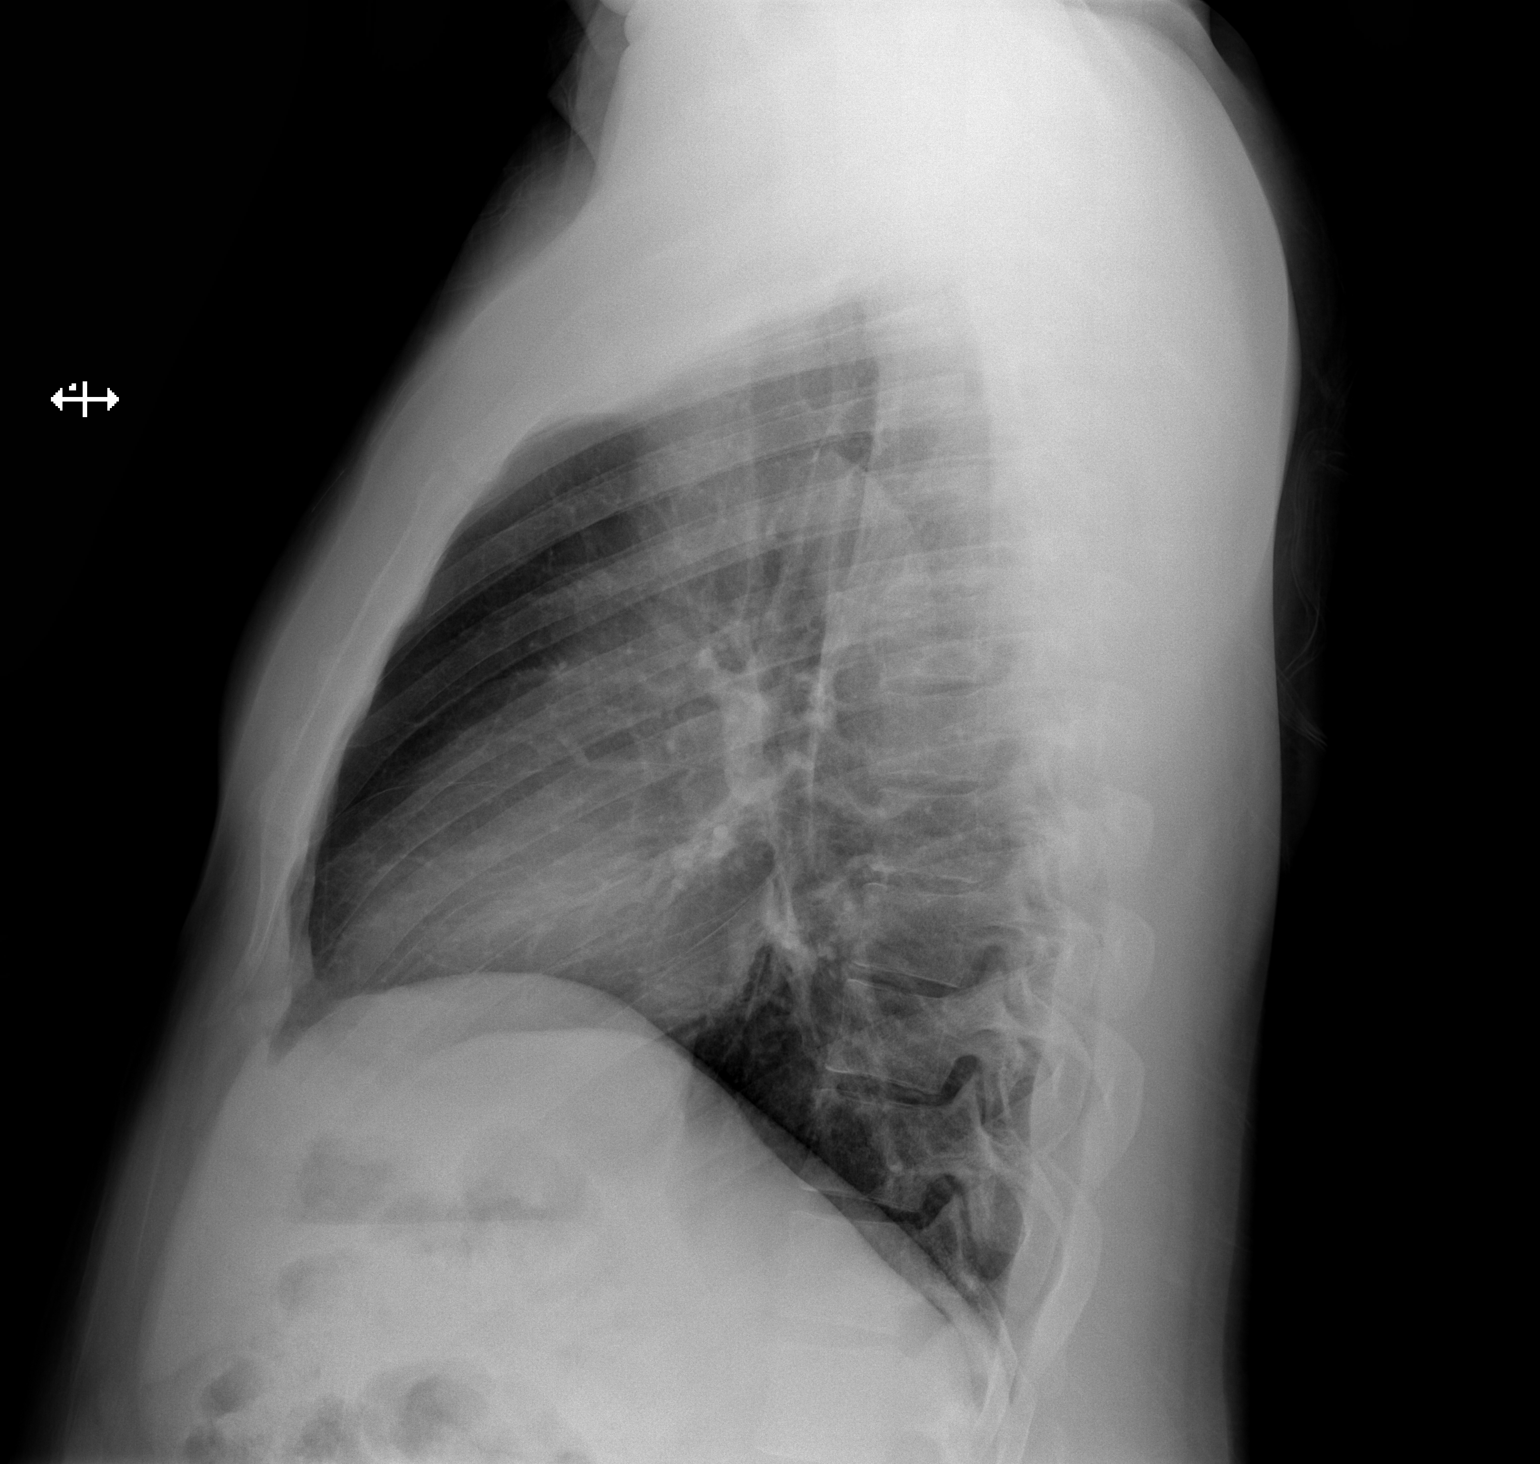

[2 of 2 positions shown; findings below may reference images not displayed]

FINDINGS: The lungs are adequately inflated and clear. The heart and pulmonary
vascularity are normal. The trachea is midline. There is no pleural
effusion or pneumothorax. The bony thorax is unremarkable.
IMPRESSION: Normal chest x-ray.

## 2016-08-09 ENCOUNTER — Emergency Department (HOSPITAL_COMMUNITY)
Admission: EM | Admit: 2016-08-09 | Discharge: 2016-08-09 | Disposition: A | Discharge status: Home / Self Care | Payer: Worker's Compensation | Attending: Emergency Medicine | Admitting: Emergency Medicine

## 2016-08-09 ENCOUNTER — Encounter (HOSPITAL_COMMUNITY): Payer: Self-pay | Admitting: Emergency Medicine

## 2016-08-09 ENCOUNTER — Emergency Department (HOSPITAL_COMMUNITY): Payer: Worker's Compensation

## 2016-08-09 DIAGNOSIS — Y92096 Garden or yard of other non-institutional residence as the place of occurrence of the external cause: Secondary | ICD-10-CM | POA: Insufficient documentation

## 2016-08-09 DIAGNOSIS — W01198A Fall on same level from slipping, tripping and stumbling with subsequent striking against other object, initial encounter: Secondary | ICD-10-CM | POA: Insufficient documentation

## 2016-08-09 DIAGNOSIS — Y999 Unspecified external cause status: Secondary | ICD-10-CM | POA: Diagnosis not present

## 2016-08-09 DIAGNOSIS — R52 Pain, unspecified: Secondary | ICD-10-CM

## 2016-08-09 DIAGNOSIS — Y939 Activity, unspecified: Secondary | ICD-10-CM | POA: Insufficient documentation

## 2016-08-09 DIAGNOSIS — S20211A Contusion of right front wall of thorax, initial encounter: Secondary | ICD-10-CM | POA: Diagnosis not present

## 2016-08-09 DIAGNOSIS — S299XXA Unspecified injury of thorax, initial encounter: Secondary | ICD-10-CM | POA: Diagnosis present

## 2016-08-09 LAB — CBC
HEMATOCRIT: 44.2 % (ref 39.0–52.0)
HEMOGLOBIN: 14.3 g/dL (ref 13.0–17.0)
MCH: 26.2 pg (ref 26.0–34.0)
MCHC: 32.4 g/dL (ref 30.0–36.0)
MCV: 81 fL (ref 78.0–100.0)
PLATELETS: 264 10*3/uL (ref 150–400)
RBC: 5.46 MIL/uL (ref 4.22–5.81)
RDW: 13.6 % (ref 11.5–15.5)
WBC: 6.8 10*3/uL (ref 4.0–10.5)

## 2016-08-09 LAB — BASIC METABOLIC PANEL
ANION GAP: 7 (ref 5–15)
BUN: 8 mg/dL (ref 6–20)
CHLORIDE: 105 mmol/L (ref 101–111)
CO2: 26 mmol/L (ref 22–32)
CREATININE: 1.16 mg/dL (ref 0.61–1.24)
Calcium: 9.7 mg/dL (ref 8.9–10.3)
GFR calc non Af Amer: >60 mL/min (ref >60)
Glucose, Bld: 158 mg/dL — ABNORMAL HIGH (ref 65–99)
POTASSIUM: 3.9 mmol/L (ref 3.5–5.1)
SODIUM: 138 mmol/L (ref 135–145)

## 2016-08-09 LAB — I-STAT TROPONIN, ED: Troponin i, poc: 0 ng/mL (ref 0.00–0.08)

## 2016-08-09 LAB — SAMPLE TO BLOOD BANK

## 2016-08-09 MED ORDER — OXYCODONE-ACETAMINOPHEN 5-325 MG PO TABS
2.0000 | ORAL_TABLET | Freq: Once | ORAL | Status: AC
  Administered 2016-08-09: 2 via ORAL
  Filled 2016-08-09: qty 2

## 2016-08-09 MED ORDER — OXYCODONE-ACETAMINOPHEN 5-325 MG PO TABS: 1.0000 | ORAL_TABLET | Freq: Three times a day (TID) | ORAL | 0 refills | Status: DC | PRN

## 2016-08-09 MED ORDER — NAPROXEN 500 MG PO TABS: 500.0000 mg | ORAL_TABLET | Freq: Two times a day (BID) | ORAL | 0 refills | Status: DC

## 2016-08-09 NOTE — Discharge Instructions (Signed)
Your x-ray does not show broken bone but you may have a bruise bone or hairline fracture that can be causing you pain. Use ice or heat packs to the area. Take naprosyn for pain and use percocet for breakthrough pain. Return for worsening symptoms, including difficulty breathing, fever, or any other symptoms concerning to you.

## 2016-08-09 NOTE — ED Triage Notes (Addendum)
Pt to ED from work site-- was carrying a ladder and tripped with ladder falling onto pt on right side. Lungs diminished on right side- pt guarding right ribs, hurts to take a deep breath.   Pt is pale, mucus membranes pale, extreme diaphoresis after fall, pt's clothing soaked with sweat.

## 2016-08-09 NOTE — ED Provider Notes (Signed)
[facility]-EMERGENCY DEPT Provider Note   CSN: [id number] Arrival date & time: 08/09/16  1458     History   Chief Complaint Chief Complaint  Patient presents with  . Fall  . Chest Pain    HPI Devin Gilmore is a 50 y.o. male.  HPI 50 year old male who presents after mechanical fall with right-sided chest pain. States that he was working in a yard and carrying a Engineer, agriculturalladder. He tripped over a tree stump on the ground and fell backwards. States that the ladder he was carrying fell onto the right side of his chest. He did not have any loss of consciousness, n/v, headache, vision or speech changes, numbness or weakness, abd pain, neck pain or back pain. C/o right sided Lower chest wall pain that is worse with movement and deep inspiration. He does not take blood thinners. States he is otherwise healthy. Past Medical History:  Diagnosis Date  . Heart murmur    echo 05 no problems    Patient Active Problem List   Diagnosis Date Noted  . Radiculopathy 12/25/2014    Past Surgical History:  Procedure Laterality Date  . BACK SURGERY  2010  . SPINAL CORD DECOMPRESSION  12/25/2014   L3  L4   Dr Yevette Edwardsumonski       Home Medications    Prior to Admission medications   Medication Sig Start Date End Date Taking? Authorizing Provider  gabapentin (NEURONTIN) 300 MG capsule Take 300 mg by mouth 3 (three) times daily.    Historical Provider, MD  naproxen (NAPROSYN) 500 MG tablet Take 1 tablet (500 mg total) by mouth 2 (two) times daily. 08/09/16   Devin Guiseana Duo Devin Dorff, MD  oxyCODONE-acetaminophen (PERCOCET/ROXICET) 5-325 MG tablet Take 1-2 tablets by mouth every 8 (eight) hours as needed for severe pain. 08/09/16   Devin Guiseana Duo Devin Jia, MD    Family History No family history on file.  Social History Social History  Substance Use Topics  . Smoking status: Never Smoker  . Smokeless tobacco: Never Used  . Alcohol use No     Allergies   Review of patient's allergies indicates no known  allergies.   Review of Systems Review of Systems 10/14 systems reviewed and are negative other than those stated in the HPI   Physical Exam Updated Vital Signs BP 150/95   Pulse (!) 59   Temp 98.5 F (36.9 C) (Oral)   Resp 16   Ht 5\' 8"  (1.727 m)   Wt 212 lb 4.8 oz (96.3 kg)   SpO2 100%   BMI 32.28 kg/m   Physical Exam Physical Exam  Nursing note and vitals reviewed. Constitutional: Well developed, well nourished, non-toxic, and in no acute distress Head: Normocephalic and atraumatic.  Mouth/Throat: Oropharynx is clear and moist.  Neck: Normal range of motion. Neck supple. no cervical spine tenderness Cardiovascular: Normal rate and regular rhythm.   Pulmonary/Chest: Effort normal and breath sounds normal. Tender to palpation over right lower chest wall. No crepitus or bruising Abdominal: Soft. There is no tenderness. There is no rebound and no guarding.  Musculoskeletal: Normal range of motion. no TLS spine tenderness Neurological: Alert, no facial droop, fluent speech, moves all extremities symmetrically, sensation to light touch in tact throughout Skin: Skin is warm and dry.  Psychiatric: Cooperative   ED Treatments / Results  Labs (all labs ordered are listed, but only abnormal results are displayed) Labs Reviewed  BASIC METABOLIC PANEL - Abnormal; Notable for the following:       Result  Value   Glucose, Bld 158 (*)    All other components within normal limits  CBC  I-STAT TROPOININ, ED  SAMPLE TO BLOOD BANK    EKG  EKG Interpretation None       Radiology Dg Ribs Unilateral W/chest Right  Result Date: 08/09/2016 CLINICAL DATA:  Right rib pain, shortness of Breath EXAM: RIGHT RIBS AND CHEST - 3+ VIEW COMPARISON:  12/16/2014 FINDINGS: Five views right ribs submitted. No infiltrate or pulmonary edema. No right rib fracture is identified. No pneumothorax. IMPRESSION: Negative. Electronically Signed   By: Natasha MeadLiviu  Pop M.D.   On: 08/09/2016 15:58     Procedures Procedures (including critical care time)  Medications Ordered in ED Medications  oxyCODONE-acetaminophen (PERCOCET/ROXICET) 5-325 MG per tablet 2 tablet (2 tablets Oral Given 08/09/16 2100)     Initial Impression / Assessment and Plan / ED Course  I have reviewed the triage vital signs and the nursing notes.  Pertinent labs & imaging results that were available during my care of the patient were reviewed by me and considered in my medical decision making (see chart for details).  Clinical Course    50 year old male who presents with right-sided chest pain after a ladder fell on top of him. He is nontoxic in no acute distress. Normal vital signs, breathing comfortably. Lungs clear.  Significant pain with movement, deep inspiration, and palpation. X-ray without fracture, question occult fracture versus rib contusion. Discussed supportive care instructions for this. Strict return and follow-up instructions reviewed. He expressed understanding of all discharge instructions and felt comfortable with the plan of care.  The patient appears reasonably screened and/or stabilized for discharge and I doubt any other medical condition or other [facility] requiring further screening, evaluation, or treatment in the ED at this time prior to discharge.    Final Clinical Impressions(s) / ED Diagnoses   Final diagnoses:  Rib contusion, right, initial encounter    New Prescriptions New Prescriptions   NAPROXEN (NAPROSYN) 500 MG TABLET    Take 1 tablet (500 mg total) by mouth 2 (two) times daily.   OXYCODONE-ACETAMINOPHEN (PERCOCET/ROXICET) 5-325 MG TABLET    Take 1-2 tablets by mouth every 8 (eight) hours as needed for severe pain.     Devin Guiseana Duo Sandrea Boer, MD 08/09/16 2102

## 2016-11-24 ENCOUNTER — Emergency Department (HOSPITAL_COMMUNITY)
Admission: EM | Admit: 2016-11-24 | Discharge: 2016-11-24 | Disposition: A | Discharge status: Home / Self Care | Payer: PRIVATE HEALTH INSURANCE | Attending: Emergency Medicine | Admitting: Emergency Medicine

## 2016-11-24 ENCOUNTER — Encounter (HOSPITAL_COMMUNITY): Payer: Self-pay | Admitting: *Deleted

## 2016-11-24 DIAGNOSIS — Z7984 Long term (current) use of oral hypoglycemic drugs: Secondary | ICD-10-CM | POA: Insufficient documentation

## 2016-11-24 DIAGNOSIS — I10 Essential (primary) hypertension: Secondary | ICD-10-CM | POA: Insufficient documentation

## 2016-11-24 DIAGNOSIS — R739 Hyperglycemia, unspecified: Secondary | ICD-10-CM | POA: Insufficient documentation

## 2016-11-24 LAB — BASIC METABOLIC PANEL
ANION GAP: 10 (ref 5–15)
BUN: 13 mg/dL (ref 6–20)
CO2: 24 mmol/L (ref 22–32)
Calcium: 9.2 mg/dL (ref 8.9–10.3)
Chloride: 100 mmol/L — ABNORMAL LOW (ref 101–111)
Creatinine, Ser: 0.98 mg/dL (ref 0.61–1.24)
Glucose, Bld: 350 mg/dL — ABNORMAL HIGH (ref 65–99)
POTASSIUM: 4 mmol/L (ref 3.5–5.1)
SODIUM: 134 mmol/L — AB (ref 135–145)

## 2016-11-24 LAB — CBC
HEMATOCRIT: 45.2 % (ref 39.0–52.0)
Hemoglobin: 15.6 g/dL (ref 13.0–17.0)
MCH: 26.7 pg (ref 26.0–34.0)
MCHC: 34.5 g/dL (ref 30.0–36.0)
MCV: 77.3 fL — ABNORMAL LOW (ref 78.0–100.0)
PLATELETS: 290 10*3/uL (ref 150–400)
RBC: 5.85 MIL/uL — ABNORMAL HIGH (ref 4.22–5.81)
RDW: 13.1 % (ref 11.5–15.5)
WBC: 10.4 10*3/uL (ref 4.0–10.5)

## 2016-11-24 LAB — URINALYSIS, ROUTINE W REFLEX MICROSCOPIC
BILIRUBIN URINE: NEGATIVE
HGB URINE DIPSTICK: NEGATIVE
Ketones, ur: NEGATIVE mg/dL
Leukocytes, UA: NEGATIVE
Nitrite: NEGATIVE
Protein, ur: NEGATIVE mg/dL
SPECIFIC GRAVITY, URINE: 1.041 — AB (ref 1.005–1.030)
pH: 6 (ref 5.0–8.0)

## 2016-11-24 LAB — URINE MICROSCOPIC-ADD ON

## 2016-11-24 LAB — CBG MONITORING, ED
GLUCOSE-CAPILLARY: 367 mg/dL — AB (ref 65–99)
GLUCOSE-CAPILLARY: 251 mg/dL — AB (ref 65–99)

## 2016-11-24 MED ORDER — SODIUM CHLORIDE 0.9 % IV BOLUS (SEPSIS)
1000.0000 mL | Freq: Once | INTRAVENOUS | Status: AC
  Administered 2016-11-24: 1000 mL via INTRAVENOUS

## 2016-11-24 MED ORDER — HYDROCHLOROTHIAZIDE 25 MG PO TABS: 25.0000 mg | ORAL_TABLET | Freq: Every day | ORAL | 0 refills | Status: DC

## 2016-11-24 MED ORDER — METFORMIN HCL ER 500 MG PO TB24: 500.0000 mg | ORAL_TABLET | Freq: Every day | ORAL | 0 refills | Status: DC

## 2016-11-24 NOTE — ED Provider Notes (Signed)
[facility]-EMERGENCY DEPT Provider Note   CSN: [id number] Arrival date & time: 11/24/16  1221  By signing my name below, I, Devin Gilmore, attest that this documentation has been prepared under the direction and in the presence of Devin Planan Aran Menning, DO . Electronically Signed: Freida Busmaniana Gilmore, Scribe. 11/24/2016. 3:14 PM.   History   Chief Complaint Chief Complaint  Patient presents with  . Hyperglycemia    The history is provided by the patient and medical records. No language interpreter was used.  Hyperglycemia  Blood sugar level PTA:  427 Onset quality:  Gradual Chronicity:  New Diabetes status:  Unable to specify Current diabetic therapy:  None Relieved by:  Nothing Ineffective treatments:  None tried Associated symptoms: increased thirst and polyuria   Associated symptoms: no abdominal pain, no chest pain, no confusion, no fever, no shortness of breath, no vomiting and no weakness   Risk factors: recent steroid use     HPI Comments:  Devin Gilmore is a 50 y.o. male who presents to the Emergency Department complaining of polydipsia and polyuria x a few days. Pt reports associated blurry vision. He denies being formally diagnosed with DM as he does not frequently go the doctor but has had elevated blood sugars in the past. Pt was recently on steroids secondary to an allergic reaction. He was sent to the ED today from Urgent care for further evaluation due to elevated blood sugar (427) and elevated blood pressure. Pt has a h/o HTN but is not currently on HTN meds. He denies CP, SOB, weakness/numbness in his extremities, and speech changes. Pt wears corrective lenses; the prescription was changed within the last year.   Past Medical History:  Diagnosis Date  . Heart murmur    echo 05 no problems    Patient Active Problem List   Diagnosis Date Noted  . Radiculopathy 12/25/2014    Past Surgical History:  Procedure Laterality Date  . BACK SURGERY  2010  . SPINAL CORD DECOMPRESSION   12/25/2014   L3  L4   Dr Yevette Edwardsumonski       Home Medications    Prior to Admission medications   Medication Sig Start Date End Date Taking? Authorizing Provider  gabapentin (NEURONTIN) 300 MG capsule Take 300 mg by mouth 3 (three) times daily.    Historical Provider, MD  hydrochlorothiazide (HYDRODIURIL) 25 MG tablet Take 1 tablet (25 mg total) by mouth daily. 11/24/16   Devin Planan Devin Syme, DO  metFORMIN (GLUCOPHAGE-XR) 500 MG 24 hr tablet Take 1 tablet (500 mg total) by mouth daily with breakfast. 11/24/16   Devin Planan Devin Perriello, DO  naproxen (NAPROSYN) 500 MG tablet Take 1 tablet (500 mg total) by mouth 2 (two) times daily. 08/09/16   Devin Guiseana Duo Liu, MD  oxyCODONE-acetaminophen (PERCOCET/ROXICET) 5-325 MG tablet Take 1-2 tablets by mouth every 8 (eight) hours as needed for severe pain. 08/09/16   Devin Guiseana Duo Liu, MD    Family History History reviewed. No pertinent family history.  Social History Social History  Substance Use Topics  . Smoking status: Never Smoker  . Smokeless tobacco: Never Used  . Alcohol use No     Allergies   Amoxicillin   Review of Systems Review of Systems  Constitutional: Negative for chills and fever.       +HTN +Hyperglycemia   HENT: Negative for congestion and facial swelling.   Eyes: Positive for visual disturbance. Negative for discharge.  Respiratory: Negative for shortness of breath.   Cardiovascular: Negative for chest pain and palpitations.  Gastrointestinal: Negative for abdominal pain, diarrhea and vomiting.  Endocrine: Positive for polydipsia and polyuria.  Musculoskeletal: Negative for arthralgias and myalgias.  Skin: Negative for color change and rash.  Neurological: Negative for tremors, syncope, speech difficulty, weakness, numbness and headaches.  Psychiatric/Behavioral: Negative for confusion and dysphoric mood.     Physical Exam Updated Vital Signs BP (!) 146/102 (BP Location: Left Arm)   Pulse 73   Temp 98.4 F (36.9 C) (Oral)   Resp 18   SpO2  100%   Physical Exam  Constitutional: He is oriented to person, place, and time. He appears well-developed and well-nourished.  HENT:  Head: Normocephalic and atraumatic.  Eyes: EOM are normal. Pupils are equal, round, and reactive to light.  Neck: Normal range of motion. Neck supple. No JVD present.  Cardiovascular: Normal rate and regular rhythm.  Exam reveals no gallop and no friction rub.   No murmur heard. Pulmonary/Chest: No respiratory distress. He has no wheezes.  Abdominal: He exhibits no distension. There is no rebound and no guarding.  Musculoskeletal: Normal range of motion. He exhibits no edema.  Neurological: He is alert and oriented to person, place, and time. He displays normal reflexes. No cranial nerve deficit or sensory deficit. He exhibits normal muscle tone. Coordination normal. GCS eye subscore is 4. GCS verbal subscore is 5. GCS motor subscore is 6.  Reflex Scores:      Tricep reflexes are 2+ on the right side and 2+ on the left side.      Bicep reflexes are 2+ on the right side and 2+ on the left side.      Brachioradialis reflexes are 2+ on the right side and 2+ on the left side.      Patellar reflexes are 2+ on the right side and 2+ on the left side.      Achilles reflexes are 2+ on the right side and 2+ on the left side. Skin: No rash noted. No pallor.  Psychiatric: He has a normal mood and affect. His behavior is normal.  Nursing note and vitals reviewed.    ED Treatments / Results  DIAGNOSTIC STUDIES:  Oxygen Saturation is 100% on RA, normal by my interpretation.    COORDINATION OF CARE:  3:07 PM Discussed treatment plan with pt at bedside and pt agreed to plan.  Labs (all labs ordered are listed, but only abnormal results are displayed) Labs Reviewed  BASIC METABOLIC PANEL - Abnormal; Notable for the following:       Result Value   Sodium 134 (*)    Chloride 100 (*)    Glucose, Bld 350 (*)    All other components within normal limits  CBC -  Abnormal; Notable for the following:    RBC 5.85 (*)    MCV 77.3 (*)    All other components within normal limits  URINALYSIS, ROUTINE W REFLEX MICROSCOPIC (NOT AT [facility]) - Abnormal; Notable for the following:    Specific Gravity, Urine 1.041 (*)    Glucose, UA >1000 (*)    All other components within normal limits  URINE MICROSCOPIC-ADD ON - Abnormal; Notable for the following:    Squamous Epithelial / LPF 0-5 (*)    Bacteria, UA RARE (*)    All other components within normal limits  CBG MONITORING, ED - Abnormal; Notable for the following:    Glucose-Capillary 367 (*)    All other components within normal limits  CBG MONITORING, ED - Abnormal; Notable for the following:  Glucose-Capillary 251 (*)    All other components within normal limits    EKG  EKG Interpretation None       Radiology No results found.  Procedures Procedures (including critical care time)  Medications Ordered in ED Medications  sodium chloride 0.9 % bolus 1,000 mL (0 mLs Intravenous Stopped 11/24/16 1556)     Initial Impression / Assessment and Plan / ED Course  I have reviewed the triage vital signs and the nursing notes.  Pertinent labs & imaging results that were available during my care of the patient were reviewed by me and considered in my medical decision making (see chart for details).  Clinical Course     50 yo F With a chief complaint of blurred vision polyphagia and polydipsia. This been going on for the past few days. Patient has a known history of hypertension but has been off medications for quite some time. He felt that his vision is blurry not worse one eye or the other. Denies headaches denies chest pain shortness breath. On my exam patient is well-appearing and nontoxic. His normal neuro exam. Labs were done in triage and reassuring. Patient is given a liter of fluids with improvement of his blood sugar. Will start on metformin and HCTZ. PCP follow-up.  4:00 PM:  I have  discussed the diagnosis/risks/treatment options with the patient and family and believe the pt to be eligible for discharge home to follow-up with PCP. We also discussed returning to the ED immediately if new or worsening sx occur. We discussed the sx which are most concerning (e.g., sudden worsening pain, fever, inability to tolerate by mouth) that necessitate immediate return. Medications administered to the patient during their visit and any new prescriptions provided to the patient are listed below.  Medications given during this visit Medications  sodium chloride 0.9 % bolus 1,000 mL (0 mLs Intravenous Stopped 11/24/16 1556)     The patient appears reasonably screen and/or stabilized for discharge and I doubt any other medical condition or other [facility] requiring further screening, evaluation, or treatment in the ED at this time prior to discharge.    Final Clinical Impressions(s) / ED Diagnoses   Final diagnoses:  Hypertension, unspecified type  Hyperglycemia    New Prescriptions New Prescriptions   HYDROCHLOROTHIAZIDE (HYDRODIURIL) 25 MG TABLET    Take 1 tablet (25 mg total) by mouth daily.   METFORMIN (GLUCOPHAGE-XR) 500 MG 24 HR TABLET    Take 1 tablet (500 mg total) by mouth daily with breakfast.    I personally performed the services described in this documentation, which was scribed in my presence. The recorded information has been reviewed and is accurate.     Devin Planan Jody Aguinaga, DO 11/24/16 1600

## 2016-11-24 NOTE — ED Triage Notes (Signed)
Pt reports recent excessive thirst and blurred vision since Saturday. [facility] and sent here due to HTN and hyperglycemia.

## 2017-01-13 ENCOUNTER — Ambulatory Visit: Payer: Self-pay | Admitting: *Deleted

## 2017-03-21 ENCOUNTER — Ambulatory Visit: Payer: Self-pay | Admitting: Orthopedic Surgery

## 2017-03-21 NOTE — H&P (Signed)
Devin Gilmore Gilmore is an 51 y.o. male.   Chief Complaint: back and leg pain HPI: The patient is Devin Gilmore 51 year old male who presents today for follow up of their back. The patient is being followed for their low back symptoms. They are now 6 1/2 months out from injury (DOI 08/09/16). Symptoms reported today include: pain. Current treatment includes: activity modification, NSAIDs and pain medications. The following medication has been used for pain control: antiinflammatory medication, Tylenol and gabapentin. The patient presents today following CT/Myelogram and EMG/NCV results. Note for "Follow-up back": The patient was placed on Devin Gilmore 5lb. lifting restriction. NCM is Devin Gilmore Gilmore.  Devin Gilmore Gilmore follows up with case manager Devin Gilmore Gilmore. He is six and Devin Gilmore half months status post his injury and continues to report pain radiating down into his right leg when he sits or when he stands too long. It radiates down into the bottom of his foot. He has also undergone his EMG and nerve conduction study.  His myelogram indicates disc herniation at L4-5 central and to the left. Postsurgical changes at L3-4, retrolisthesis of L5 on S1. There is Devin Gilmore small disc protrusion at L5-S1 to the right.  His MRI demonstrated disc herniations at L4-5 to the right and into the left.  Past Medical History:  Diagnosis Date  . Heart murmur    echo 05 no problems    Past Surgical History:  Procedure Laterality Date  . BACK SURGERY  2010  . SPINAL CORD DECOMPRESSION  12/25/2014   L3  L4   Dr Devin Gilmore Gilmore    No family history on file. Social History:  reports that he has never smoked. He has never used smokeless tobacco. He reports that he does not drink alcohol or use drugs.  Allergies:  Allergies  Allergen Reactions  . Amoxicillin      (Not in Devin Gilmore hospital admission)  No results found for this or any previous visit (from the past 48 hour(s)). No results found.  Review of Systems  Constitutional: Negative.   HENT:  Negative.   Eyes: Negative.   Respiratory: Negative.   Cardiovascular: Negative.   Gastrointestinal: Negative.   Genitourinary: Negative.   Musculoskeletal: Positive for back pain.  Skin: Negative.   Neurological: Positive for sensory change and focal weakness.    There were no vitals taken for this visit. Physical Exam  Constitutional: He is oriented to person, place, and time. He appears well-developed and well-nourished.  HENT:  Head: Normocephalic.  Eyes: Pupils are equal, round, and reactive to light.  Neck: Normal range of motion.  Cardiovascular: Normal rate.   Respiratory: Effort normal.  GI: Soft.  Musculoskeletal:  On exam, he is in moderate distress. Straight leg raises, buttock and thigh pain on the right, negative on the left. Trace EHL weakness. Pain with extension.  Lumbar spine exam reveals no evidence of soft tissue swelling, deformity or skin ecchymosis. On palpation there is no tenderness of the lumbar spine. No flank pain with percussion. The abdomen is soft and nontender. Nontender over the trochanters. No cellulitis or lymphadenopathy.  Good range of motion of the lumbar spine without associated pain. Straight leg raise is negative. Motor is 5/5 including EHL, tibialis anterior, plantar flexion, quadriceps and hamstrings. Patient is normoreflexic. There is no Babinski or clonus. Sensory exam is intact to light touch. Patient has good distal pulses. No DVT. No pain and normal range of motion without instability of the hips, knees and ankles.  Neurological: He is alert and oriented  to person, place, and time.    Myelogram was reviewed. On the lateral, there is attenuation in extension of the dye column. On the AP, there is Devin Gilmore near absence of dye at the level of L4-5. Paracentral protrusion is noted at L4-5 to the left and mild to the right.  Again, MRI demonstrates lateral recess stenosis bilaterally with disc herniation at L4-5 to the right and L4-5 to the  left.  Assessment/Plan Right lower extremity radicular pain secondary to lateral recess stenosis at L4-5 with disc protrusion at L4-5 paracentral to the right and left refractory to rest, activity modification, therapy, epidural steroid injection, with an EMG and nerve conduction study that is unremarkable ruling out diabetic neuropathy. The patient is an underlying diabetic.  We discussed options at this point in time, live with his symptoms utilizing pharmacologic management including gabapentin, Mobic and/or Tylenol as well as activity restriction. Second option is to consider Devin Gilmore decompression at L4-5. I would recommend bilaterally given the pathology noted by myelogram and by MRI.  I had an extensive discussion of the risks and benefits of the lumbar decompression with the patient including bleeding, infection, damage to neurovascular structures, epidural fibrosis, CSF leak requiring repair. We also discussed increase in pain, adjacent segment disease, recurrent disc herniation, need for future surgery including repeat decompression and/or fusion. We also discussed risks of postoperative hematoma, paralysis, anesthetic complications including DVT, PE, death, cardiopulmonary dysfunction. In addition, the perioperative and postoperative courses were discussed in detail including the rehabilitative time and return to functional activity and work. I provided the patient with an illustrated handout and utilized the appropriate surgical models.  We did, however, discuss the possibility of Devin Gilmore recurrent disc herniation, need for fusion in the future as he experienced at L3-4. I do not feel his L3-4 fusion is Devin Gilmore factor here. He has Devin Gilmore small protrusion at L5-S1, but no evidence of Devin Gilmore neural compressive lesion. We discussed overnight in the hospital, sutures out in 2 weeks, 6 weeks until light duty as an option, 12 weeks until he is at maximal medical improvement and would rate and release him.  His main challenge is  going to be his requirement to have to bend and twist, which will abnormally load the disc. I discussed this separately and in front of the patient with Devin Gilmore Gilmore, his case manager. He is unsure as to what his last A1c was. His medical physician is Dr. Ron Polite. We will have him kindly submit Devin Gilmore preoperative clearance. Continue light duty, no lifting over 5 pounds in the interim. I gave him Devin Gilmore prescription of Mobic, GI side effects discussed.  Plan microlumbar decompression L4-5 bilateral  Kristyanna Barcelo M., PA-C for Dr. Beane 03/21/2017, 8:44 AM   

## 2017-03-22 ENCOUNTER — Ambulatory Visit: Payer: Self-pay | Admitting: Orthopedic Surgery

## 2017-03-29 ENCOUNTER — Encounter (HOSPITAL_COMMUNITY): Payer: Self-pay

## 2017-03-29 NOTE — Patient Instructions (Addendum)
Devin Gilmore  03/29/2017   Your procedure is scheduled on: 04-06-17  Report to [facility]  Entrance take [facility]  elevators to 3rd floor to  Short Stay Center at [phone].  Call this number if you have problems the morning of surgery [phone]   Remember: ONLY 1 PERSON MAY GO WITH YOU TO SHORT STAY TO GET  READY MORNING OF YOUR SURGERY.  Do not eat food or drink liquids :After Midnight.     Take these medicines the morning of surgery with A SIP OF WATER: Gabapentin ( Neurontin), Oxycodone ( Percocet)  If needed   DO NOT TAKE ANY DIABETIC MEDICATIONS DAY OF YOUR SURGERY                               You may not have any metal on your body including hair pins and              piercings  Do not wear jewelry, , lotions, powders or perfumes, deodorant                         Men may shave face and neck.   Do not bring valuables to the hospital. [facility] IS NOT             RESPONSIBLE   FOR VALUABLES.  Contacts, dentures or bridgework may not be worn into surgery.  Leave suitcase in the car. After surgery it may be brought to your room.                     Please read over the following fact sheets you were given: _____________________________________________________________________             [facility] - Preparing for Surgery Before surgery, you can play an important role.  Because skin is not sterile, your skin needs to be as free of germs as possible.  You can reduce the number of germs on your skin by washing with CHG (chlorahexidine gluconate) soap before surgery.  CHG is an antiseptic cleaner which kills germs and bonds with the skin to continue killing germs even after washing. Please DO NOT use if you have an allergy to CHG or antibacterial soaps.  If your skin becomes reddened/irritated stop using the CHG and inform your nurse when you arrive at Short Stay. Do not shave (including legs and underarms) for at least 48 hours prior to the  first CHG shower.  You may shave your face/neck. Please follow these instructions carefully:  1.  Shower with CHG Soap the night before surgery and the  morning of Surgery.  2.  If you choose to wash your hair, wash your hair first as usual with your  normal  shampoo.  3.  After you shampoo, rinse your hair and body thoroughly to remove the  shampoo.                           4.  Use CHG as you would any other liquid soap.  You can apply chg directly  to the skin and wash                       Gently with a scrungie or clean washcloth.  5.  Apply the CHG Soap to your body ONLY FROM THE NECK DOWN.   Do not use on face/ open                           Wound or open sores. Avoid contact with eyes, ears mouth and genitals (private parts).                       Wash face,  Genitals (private parts) with your normal soap.             6.  Wash thoroughly, paying special attention to the area where your surgery  will be performed.  7.  Thoroughly rinse your body with warm water from the neck down.  8.  DO NOT shower/wash with your normal soap after using and rinsing off  the CHG Soap.                9.  Pat yourself dry with a clean towel.            10.  Wear clean pajamas.            11.  Place clean sheets on your bed the night of your first shower and do not  sleep with pets. Day of Surgery : Do not apply any lotions/deodorants the morning of surgery.  Please wear clean clothes to the hospital/surgery center.  FAILURE TO FOLLOW THESE INSTRUCTIONS MAY RESULT IN THE CANCELLATION OF YOUR SURGERY PATIENT SIGNATURE_________________________________  NURSE SIGNATURE__________________________________  ________________________________________________________________________   [name]  An incentive spirometer is a tool that can help keep your lungs clear and active. This tool measures how well you are filling your lungs with each breath. Taking long deep breaths may help reverse or decrease  the chance of developing breathing (pulmonary) problems (especially infection) following:  A long period of time when you are unable to move or be active. BEFORE THE PROCEDURE   If the spirometer includes an indicator to show your best effort, your nurse or respiratory therapist will set it to a desired goal.  If possible, sit up straight or lean slightly forward. Try not to slouch.  Hold the incentive spirometer in an upright position. INSTRUCTIONS FOR USE  1. Sit on the edge of your bed if possible, or sit up as far as you can in bed or on a chair. 2. Hold the incentive spirometer in an upright position. 3. Breathe out normally. 4. Place the mouthpiece in your mouth and seal your lips tightly around it. 5. Breathe in slowly and as deeply as possible, raising the piston or the ball toward the top of the column. 6. Hold your breath for 3-5 seconds or for as long as possible. Allow the piston or ball to fall to the bottom of the column. 7. Remove the mouthpiece from your mouth and breathe out normally. 8. Rest for a few seconds and repeat Steps 1 through 7 at least 10 times every 1-2 hours when you are awake. Take your time and take a few normal breaths between deep breaths. 9. The spirometer may include an indicator to show your best effort. Use the indicator as a goal to work toward during each repetition. 10. After each set of 10 deep breaths, practice coughing to be sure your lungs are clear. If you have an incision (the cut made at the time of surgery), support your incision when coughing by placing a pillow or  rolled up towels firmly against it. Once you are able to get out of bed, walk around indoors and cough well. You may stop using the incentive spirometer when instructed by your caregiver.  RISKS AND COMPLICATIONS  Take your time so you do not get dizzy or light-headed.  If you are in pain, you may need to take or ask for pain medication before doing incentive spirometry. It is  harder to take a deep breath if you are having pain. AFTER USE  Rest and breathe slowly and easily.  It can be helpful to keep track of a log of your progress. Your caregiver can provide you with a simple table to help with this. If you are using the spirometer at home, follow these instructions: [place] IF:   You are having difficultly using the spirometer.  You have trouble using the spirometer as often as instructed.  Your pain medication is not giving enough relief while using the spirometer.  You develop fever of 100.5 F (38.1 C) or higher. SEEK IMMEDIATE MEDICAL CARE IF:   You cough up bloody sputum that had not been present before.  You develop fever of 102 F (38.9 C) or greater.  You develop worsening pain at or near the incision site. MAKE SURE YOU:   Understand these instructions.  Will watch your condition.  Will get help right away if you are not doing well or get worse. Document Released: 04/25/2007 Document Revised: 03/06/2012 Document Reviewed: 06/26/2007 [facility] Patient Information 2014 [place], [place].   ________________________________________________________________________

## 2017-03-29 NOTE — Progress Notes (Signed)
08-09-16 (EPIC) CXR  03-02-17  HGA1C, Lipid, CMP on chart

## 2017-03-31 ENCOUNTER — Encounter (HOSPITAL_COMMUNITY): Payer: Self-pay

## 2017-03-31 ENCOUNTER — Encounter (HOSPITAL_COMMUNITY)
Admission: RE | Admit: 2017-03-31 | Discharge: 2017-03-31 | Disposition: A | Discharge status: Home / Self Care | Payer: PRIVATE HEALTH INSURANCE | Source: Ambulatory Visit | Attending: Specialist | Admitting: Specialist

## 2017-03-31 ENCOUNTER — Ambulatory Visit (HOSPITAL_COMMUNITY)
Admission: RE | Admit: 2017-03-31 | Discharge: 2017-03-31 | Disposition: A | Discharge status: Home / Self Care | Payer: PRIVATE HEALTH INSURANCE | Source: Ambulatory Visit | Attending: Orthopedic Surgery | Admitting: Orthopedic Surgery

## 2017-03-31 DIAGNOSIS — M5126 Other intervertebral disc displacement, lumbar region: Secondary | ICD-10-CM | POA: Insufficient documentation

## 2017-03-31 DIAGNOSIS — M4186 Other forms of scoliosis, lumbar region: Secondary | ICD-10-CM | POA: Insufficient documentation

## 2017-03-31 DIAGNOSIS — Z01812 Encounter for preprocedural laboratory examination: Secondary | ICD-10-CM | POA: Insufficient documentation

## 2017-03-31 DIAGNOSIS — Z01810 Encounter for preprocedural cardiovascular examination: Secondary | ICD-10-CM | POA: Insufficient documentation

## 2017-03-31 HISTORY — DX: Type 2 diabetes mellitus without complications: E11.9

## 2017-03-31 LAB — CBC
HCT: 41.5 % (ref 39.0–52.0)
HEMOGLOBIN: 13.8 g/dL (ref 13.0–17.0)
MCH: 25.5 pg — AB (ref 26.0–34.0)
MCHC: 33.3 g/dL (ref 30.0–36.0)
MCV: 76.6 fL — ABNORMAL LOW (ref 78.0–100.0)
Platelets: 275 10*3/uL (ref 150–400)
RBC: 5.42 MIL/uL (ref 4.22–5.81)
RDW: 13.3 % (ref 11.5–15.5)
WBC: 4.5 10*3/uL (ref 4.0–10.5)

## 2017-03-31 LAB — BASIC METABOLIC PANEL
ANION GAP: 4 — AB (ref 5–15)
BUN: 14 mg/dL (ref 6–20)
CO2: 27 mmol/L (ref 22–32)
Calcium: 9.3 mg/dL (ref 8.9–10.3)
Chloride: 107 mmol/L (ref 101–111)
Creatinine, Ser: 1.01 mg/dL (ref 0.61–1.24)
GFR calc Af Amer: >60 mL/min (ref >60)
GFR calc non Af Amer: >60 mL/min (ref >60)
GLUCOSE: 125 mg/dL — AB (ref 65–99)
POTASSIUM: 4.1 mmol/L (ref 3.5–5.1)
Sodium: 138 mmol/L (ref 135–145)

## 2017-03-31 LAB — SURGICAL PCR SCREEN
MRSA, PCR: NEGATIVE
Staphylococcus aureus: NEGATIVE

## 2017-03-31 LAB — GLUCOSE, CAPILLARY: Glucose-Capillary: 153 mg/dL — ABNORMAL HIGH (ref 65–99)

## 2017-03-31 NOTE — Progress Notes (Signed)
HGBA1C done 03/02/17- on chart along with LOV- Dr Nehemiah Settle on chart

## 2017-03-31 NOTE — Progress Notes (Signed)
03-31-17 EKG results reviewed by Dr. Jairo Ben. No new orders given.

## 2017-04-06 ENCOUNTER — Ambulatory Visit (HOSPITAL_COMMUNITY): Payer: Worker's Compensation | Admitting: Anesthesiology

## 2017-04-06 ENCOUNTER — Encounter (HOSPITAL_COMMUNITY)
Admission: RE | Disposition: A | Discharge status: Home / Self Care | Payer: Self-pay | Source: Ambulatory Visit | Attending: Specialist

## 2017-04-06 ENCOUNTER — Ambulatory Visit (HOSPITAL_COMMUNITY): Payer: Worker's Compensation

## 2017-04-06 ENCOUNTER — Encounter (HOSPITAL_COMMUNITY): Payer: Self-pay | Admitting: *Deleted

## 2017-04-06 ENCOUNTER — Ambulatory Visit (HOSPITAL_COMMUNITY)
Admission: RE | Admit: 2017-04-06 | Discharge: 2017-04-07 | Disposition: A | Discharge status: Home / Self Care | Payer: Worker's Compensation | Source: Ambulatory Visit | Attending: Specialist | Admitting: Specialist

## 2017-04-06 DIAGNOSIS — M5116 Intervertebral disc disorders with radiculopathy, lumbar region: Secondary | ICD-10-CM | POA: Diagnosis not present

## 2017-04-06 DIAGNOSIS — M48061 Spinal stenosis, lumbar region without neurogenic claudication: Secondary | ICD-10-CM | POA: Diagnosis present

## 2017-04-06 DIAGNOSIS — I10 Essential (primary) hypertension: Secondary | ICD-10-CM | POA: Insufficient documentation

## 2017-04-06 DIAGNOSIS — Z981 Arthrodesis status: Secondary | ICD-10-CM | POA: Diagnosis not present

## 2017-04-06 DIAGNOSIS — M5127 Other intervertebral disc displacement, lumbosacral region: Secondary | ICD-10-CM | POA: Insufficient documentation

## 2017-04-06 DIAGNOSIS — Z7984 Long term (current) use of oral hypoglycemic drugs: Secondary | ICD-10-CM | POA: Insufficient documentation

## 2017-04-06 DIAGNOSIS — E119 Type 2 diabetes mellitus without complications: Secondary | ICD-10-CM | POA: Insufficient documentation

## 2017-04-06 DIAGNOSIS — Z9889 Other specified postprocedural states: Secondary | ICD-10-CM | POA: Diagnosis not present

## 2017-04-06 DIAGNOSIS — Z880 Allergy status to penicillin: Secondary | ICD-10-CM | POA: Insufficient documentation

## 2017-04-06 DIAGNOSIS — Z79899 Other long term (current) drug therapy: Secondary | ICD-10-CM | POA: Diagnosis not present

## 2017-04-06 DIAGNOSIS — Z87892 Personal history of anaphylaxis: Secondary | ICD-10-CM | POA: Diagnosis not present

## 2017-04-06 DIAGNOSIS — Z419 Encounter for procedure for purposes other than remedying health state, unspecified: Secondary | ICD-10-CM

## 2017-04-06 DIAGNOSIS — M5126 Other intervertebral disc displacement, lumbar region: Secondary | ICD-10-CM

## 2017-04-06 HISTORY — PX: LUMBAR LAMINECTOMY/DECOMPRESSION MICRODISCECTOMY: SHX5026

## 2017-04-06 LAB — GLUCOSE, CAPILLARY
Glucose-Capillary: 124 mg/dL — ABNORMAL HIGH (ref 65–99)
Glucose-Capillary: 133 mg/dL — ABNORMAL HIGH (ref 65–99)
Glucose-Capillary: 168 mg/dL — ABNORMAL HIGH (ref 65–99)
Glucose-Capillary: 162 mg/dL — ABNORMAL HIGH (ref 65–99)

## 2017-04-06 SURGERY — LUMBAR LAMINECTOMY/DECOMPRESSION MICRODISCECTOMY 1 LEVEL
Anesthesia: General | Laterality: Bilateral

## 2017-04-06 MED ORDER — PROPOFOL 10 MG/ML IV BOLUS
INTRAVENOUS | Status: DC | PRN
  Administered 2017-04-06: 150 mg via INTRAVENOUS

## 2017-04-06 MED ORDER — PROMETHAZINE HCL 25 MG/ML IJ SOLN: 6.2500 mg | INTRAMUSCULAR | Status: DC | PRN

## 2017-04-06 MED ORDER — POLYETHYLENE GLYCOL 3350 17 G PO PACK: 17.0000 g | PACK | Freq: Every day | ORAL | Status: DC | PRN

## 2017-04-06 MED ORDER — ONDANSETRON HCL 4 MG/2ML IJ SOLN
INTRAMUSCULAR | Status: DC | PRN
  Administered 2017-04-06: 4 mg via INTRAVENOUS

## 2017-04-06 MED ORDER — DOCUSATE SODIUM 100 MG PO CAPS: 100.0000 mg | ORAL_CAPSULE | Freq: Two times a day (BID) | ORAL | 1 refills | Status: DC | PRN

## 2017-04-06 MED ORDER — PHENYLEPHRINE 40 MCG/ML (10ML) SYRINGE FOR IV PUSH (FOR BLOOD PRESSURE SUPPORT)
PREFILLED_SYRINGE | INTRAVENOUS | Status: DC | PRN
  Administered 2017-04-06 (×2): 80 ug via INTRAVENOUS

## 2017-04-06 MED ORDER — ROCURONIUM BROMIDE 50 MG/5ML IV SOSY
PREFILLED_SYRINGE | INTRAVENOUS | Status: DC | PRN
  Administered 2017-04-06: 10 mg via INTRAVENOUS
  Administered 2017-04-06: 50 mg via INTRAVENOUS

## 2017-04-06 MED ORDER — INSULIN ASPART 100 UNIT/ML ~~LOC~~ SOLN
0.0000 [IU] | Freq: Three times a day (TID) | SUBCUTANEOUS | Status: DC
  Administered 2017-04-07: 3 [IU] via SUBCUTANEOUS
  Administered 2017-04-07: 2 [IU] via SUBCUTANEOUS
  Filled 2017-04-06: qty 1

## 2017-04-06 MED ORDER — CEFAZOLIN SODIUM-DEXTROSE 2-4 GM/100ML-% IV SOLN
2.0000 g | INTRAVENOUS | Status: DC
  Filled 2017-04-06: qty 100

## 2017-04-06 MED ORDER — GELATIN ABSORBABLE MT POWD
OROMUCOSAL | Status: DC | PRN
  Administered 2017-04-06: 10 mL via TOPICAL

## 2017-04-06 MED ORDER — DEXAMETHASONE SODIUM PHOSPHATE 10 MG/ML IJ SOLN
INTRAMUSCULAR | Status: DC | PRN
  Administered 2017-04-06: 10 mg via INTRAVENOUS

## 2017-04-06 MED ORDER — LIDOCAINE 2% (20 MG/ML) 5 ML SYRINGE
INTRAMUSCULAR | Status: DC | PRN
  Administered 2017-04-06: 100 mg via INTRAVENOUS

## 2017-04-06 MED ORDER — BISACODYL 5 MG PO TBEC: 5.0000 mg | DELAYED_RELEASE_TABLET | Freq: Every day | ORAL | Status: DC | PRN

## 2017-04-06 MED ORDER — RISAQUAD PO CAPS
1.0000 | ORAL_CAPSULE | Freq: Every day | ORAL | Status: DC
  Administered 2017-04-07: 1 via ORAL
  Filled 2017-04-06: qty 1

## 2017-04-06 MED ORDER — POTASSIUM CHLORIDE IN NACL 20-0.45 MEQ/L-% IV SOLN
INTRAVENOUS | Status: DC
  Administered 2017-04-06: 50 mL/h via INTRAVENOUS
  Filled 2017-04-06 (×2): qty 1000

## 2017-04-06 MED ORDER — VANCOMYCIN HCL IN DEXTROSE 1-5 GM/200ML-% IV SOLN
INTRAVENOUS | Status: AC
  Filled 2017-04-06: qty 200

## 2017-04-06 MED ORDER — MEPERIDINE HCL 50 MG/ML IJ SOLN: 6.2500 mg | INTRAMUSCULAR | Status: DC | PRN

## 2017-04-06 MED ORDER — MENTHOL 3 MG MT LOZG: 1.0000 | LOZENGE | OROMUCOSAL | Status: DC | PRN

## 2017-04-06 MED ORDER — DOCUSATE SODIUM 100 MG PO CAPS
100.0000 mg | ORAL_CAPSULE | Freq: Two times a day (BID) | ORAL | Status: DC
  Administered 2017-04-06 – 2017-04-07 (×2): 100 mg via ORAL
  Filled 2017-04-06 (×2): qty 1

## 2017-04-06 MED ORDER — PHENOL 1.4 % MT LIQD: 1.0000 | OROMUCOSAL | Status: DC | PRN

## 2017-04-06 MED ORDER — OXYCODONE-ACETAMINOPHEN 5-325 MG PO TABS: 1.0000 | ORAL_TABLET | ORAL | 0 refills | Status: DC | PRN

## 2017-04-06 MED ORDER — MIDAZOLAM HCL 2 MG/2ML IJ SOLN
INTRAMUSCULAR | Status: DC | PRN
  Administered 2017-04-06 (×2): 1 mg via INTRAVENOUS

## 2017-04-06 MED ORDER — LIDOCAINE-EPINEPHRINE (PF) 1 %-1:200000 IJ SOLN
INTRAMUSCULAR | Status: DC | PRN
  Administered 2017-04-06: 10 mL

## 2017-04-06 MED ORDER — LISINOPRIL 10 MG PO TABS
10.0000 mg | ORAL_TABLET | Freq: Every day | ORAL | Status: DC
  Administered 2017-04-07: 10 mg via ORAL
  Filled 2017-04-06: qty 1

## 2017-04-06 MED ORDER — POLYETHYLENE GLYCOL 3350 17 G PO PACK: 17.0000 g | PACK | Freq: Every day | ORAL | 0 refills | Status: DC

## 2017-04-06 MED ORDER — FENTANYL CITRATE (PF) 250 MCG/5ML IJ SOLN
INTRAMUSCULAR | Status: DC | PRN
  Administered 2017-04-06: 25 ug via INTRAVENOUS
  Administered 2017-04-06 (×2): 50 ug via INTRAVENOUS
  Administered 2017-04-06: 25 ug via INTRAVENOUS
  Administered 2017-04-06: 50 ug via INTRAVENOUS

## 2017-04-06 MED ORDER — SUGAMMADEX SODIUM 200 MG/2ML IV SOLN
INTRAVENOUS | Status: AC
  Filled 2017-04-06: qty 2

## 2017-04-06 MED ORDER — HYDROMORPHONE HCL 2 MG/ML IJ SOLN: 1.0000 mg | INTRAMUSCULAR | Status: DC | PRN

## 2017-04-06 MED ORDER — THROMBIN 5000 UNITS EX SOLR
CUTANEOUS | Status: AC
  Filled 2017-04-06: qty 10000

## 2017-04-06 MED ORDER — SUGAMMADEX SODIUM 200 MG/2ML IV SOLN
INTRAVENOUS | Status: DC | PRN
  Administered 2017-04-06: 200 mg via INTRAVENOUS

## 2017-04-06 MED ORDER — SODIUM CHLORIDE 0.9 % IR SOLN
Status: DC | PRN
  Administered 2017-04-06: 500 mL

## 2017-04-06 MED ORDER — HYDROMORPHONE HCL 2 MG/ML IJ SOLN
INTRAMUSCULAR | Status: AC
  Filled 2017-04-06: qty 1

## 2017-04-06 MED ORDER — ROCURONIUM BROMIDE 50 MG/5ML IV SOSY
PREFILLED_SYRINGE | INTRAVENOUS | Status: AC
  Filled 2017-04-06: qty 5

## 2017-04-06 MED ORDER — MAGNESIUM CITRATE PO SOLN: 1.0000 | Freq: Once | ORAL | Status: DC | PRN

## 2017-04-06 MED ORDER — PROPOFOL 10 MG/ML IV BOLUS
INTRAVENOUS | Status: AC
  Filled 2017-04-06: qty 20

## 2017-04-06 MED ORDER — LIDOCAINE 2% (20 MG/ML) 5 ML SYRINGE
INTRAMUSCULAR | Status: AC
  Filled 2017-04-06: qty 5

## 2017-04-06 MED ORDER — VANCOMYCIN HCL 1000 MG IV SOLR
1000.0000 mg | Freq: Once | INTRAVENOUS | Status: AC
  Administered 2017-04-06: 1000 mg via INTRAVENOUS
  Filled 2017-04-06: qty 1000

## 2017-04-06 MED ORDER — OXYCODONE HCL 5 MG PO TABS
5.0000 mg | ORAL_TABLET | ORAL | Status: DC | PRN
  Administered 2017-04-06: 10 mg via ORAL
  Administered 2017-04-06 (×2): 5 mg via ORAL
  Administered 2017-04-07 (×2): 10 mg via ORAL
  Filled 2017-04-06 (×4): qty 2
  Filled 2017-04-06: qty 1

## 2017-04-06 MED ORDER — ONDANSETRON HCL 4 MG/2ML IJ SOLN
INTRAMUSCULAR | Status: AC
  Filled 2017-04-06: qty 2

## 2017-04-06 MED ORDER — GABAPENTIN 300 MG PO CAPS
300.0000 mg | ORAL_CAPSULE | Freq: Three times a day (TID) | ORAL | Status: DC
  Administered 2017-04-06 – 2017-04-07 (×3): 300 mg via ORAL
  Filled 2017-04-06 (×3): qty 1

## 2017-04-06 MED ORDER — VANCOMYCIN HCL 1000 MG IV SOLR
1500.0000 mg | Freq: Once | INTRAVENOUS | Status: DC
  Filled 2017-04-06: qty 2000

## 2017-04-06 MED ORDER — ONDANSETRON HCL 4 MG/2ML IJ SOLN: 4.0000 mg | Freq: Four times a day (QID) | INTRAMUSCULAR | Status: DC | PRN

## 2017-04-06 MED ORDER — LACTATED RINGERS IV SOLN
INTRAVENOUS | Status: DC
  Administered 2017-04-06: 10:00:00 via INTRAVENOUS

## 2017-04-06 MED ORDER — ALUM & MAG HYDROXIDE-SIMETH 200-200-20 MG/5ML PO SUSP: 30.0000 mL | Freq: Four times a day (QID) | ORAL | Status: DC | PRN

## 2017-04-06 MED ORDER — MIDAZOLAM HCL 2 MG/2ML IJ SOLN
INTRAMUSCULAR | Status: AC
  Filled 2017-04-06: qty 2

## 2017-04-06 MED ORDER — BUPIVACAINE-EPINEPHRINE (PF) 0.5% -1:200000 IJ SOLN
INTRAMUSCULAR | Status: AC
  Filled 2017-04-06: qty 30

## 2017-04-06 MED ORDER — PHENYLEPHRINE 40 MCG/ML (10ML) SYRINGE FOR IV PUSH (FOR BLOOD PRESSURE SUPPORT)
PREFILLED_SYRINGE | INTRAVENOUS | Status: AC
  Filled 2017-04-06: qty 10

## 2017-04-06 MED ORDER — ACETAMINOPHEN 325 MG PO TABS: 650.0000 mg | ORAL_TABLET | ORAL | Status: DC | PRN

## 2017-04-06 MED ORDER — VANCOMYCIN HCL IN DEXTROSE 1-5 GM/200ML-% IV SOLN
1000.0000 mg | Freq: Once | INTRAVENOUS | Status: AC
  Administered 2017-04-06: 1000 mg via INTRAVENOUS
  Filled 2017-04-06: qty 200

## 2017-04-06 MED ORDER — ACETAMINOPHEN 10 MG/ML IV SOLN
1000.0000 mg | Freq: Four times a day (QID) | INTRAVENOUS | Status: DC
  Administered 2017-04-06: 1000 mg via INTRAVENOUS
  Filled 2017-04-06: qty 100

## 2017-04-06 MED ORDER — METHOCARBAMOL 500 MG PO TABS
500.0000 mg | ORAL_TABLET | Freq: Four times a day (QID) | ORAL | Status: DC | PRN
  Administered 2017-04-07: 500 mg via ORAL
  Filled 2017-04-06 (×2): qty 1

## 2017-04-06 MED ORDER — ONDANSETRON HCL 4 MG PO TABS
4.0000 mg | ORAL_TABLET | Freq: Four times a day (QID) | ORAL | Status: DC | PRN
Start: 2017-04-06 — End: 2017-04-07

## 2017-04-06 MED ORDER — LACTATED RINGERS IV SOLN: INTRAVENOUS | Status: DC

## 2017-04-06 MED ORDER — DEXTROSE 5 % IV SOLN
500.0000 mg | Freq: Four times a day (QID) | INTRAVENOUS | Status: DC | PRN
  Administered 2017-04-06: 500 mg via INTRAVENOUS
  Filled 2017-04-06: qty 550
  Filled 2017-04-06: qty 5

## 2017-04-06 MED ORDER — DEXAMETHASONE SODIUM PHOSPHATE 10 MG/ML IJ SOLN
INTRAMUSCULAR | Status: AC
  Filled 2017-04-06: qty 1

## 2017-04-06 MED ORDER — ACETAMINOPHEN 650 MG RE SUPP: 650.0000 mg | RECTAL | Status: DC | PRN

## 2017-04-06 MED ORDER — SODIUM CHLORIDE 0.9 % IR SOLN
Status: AC
  Filled 2017-04-06: qty 500000

## 2017-04-06 MED ORDER — FENTANYL CITRATE (PF) 250 MCG/5ML IJ SOLN
INTRAMUSCULAR | Status: AC
  Filled 2017-04-06: qty 5

## 2017-04-06 MED ORDER — HYDROMORPHONE HCL 2 MG/ML IJ SOLN
0.2500 mg | INTRAMUSCULAR | Status: DC | PRN
  Administered 2017-04-06 (×2): 0.5 mg via INTRAVENOUS

## 2017-04-06 MED ORDER — METHOCARBAMOL 500 MG PO TABS: 500.0000 mg | ORAL_TABLET | Freq: Four times a day (QID) | ORAL | 1 refills | Status: DC | PRN

## 2017-04-06 SURGICAL SUPPLY — 50 items
BAG SPEC THK2 15X12 ZIP CLS (MISCELLANEOUS)
BAG ZIPLOCK 12X15 (MISCELLANEOUS) IMPLANT
CLEANER TIP ELECTROSURG 2X2 (MISCELLANEOUS) ×2 IMPLANT
CLOTH 2% CHLOROHEXIDINE 3PK (PERSONAL CARE ITEMS) ×2 IMPLANT
COVER SURGICAL LIGHT HANDLE (MISCELLANEOUS) ×2 IMPLANT
DRAPE MICROSCOPE LEICA (MISCELLANEOUS) ×2 IMPLANT
DRAPE POUCH INSTRU U-SHP 10X18 (DRAPES) ×2 IMPLANT
DRAPE SHEET LG 3/4 BI-LAMINATE (DRAPES) ×2 IMPLANT
DRAPE SURG 17X11 SM STRL (DRAPES) ×2 IMPLANT
DRAPE UTILITY XL STRL (DRAPES) ×2 IMPLANT
DRESSING AQUACEL AG SP 3.5X6 (GAUZE/BANDAGES/DRESSINGS) ×1 IMPLANT
DRSG AQUACEL AG ADV 3.5X 4 (GAUZE/BANDAGES/DRESSINGS) IMPLANT
DRSG AQUACEL AG ADV 3.5X 6 (GAUZE/BANDAGES/DRESSINGS) IMPLANT
DRSG AQUACEL AG SP 3.5X6 (GAUZE/BANDAGES/DRESSINGS) ×2
DURAPREP 26ML APPLICATOR (WOUND CARE) ×2 IMPLANT
DURASEAL SPINE SEALANT 3ML (MISCELLANEOUS) IMPLANT
ELECT BLADE TIP CTD 4 INCH (ELECTRODE) IMPLANT
ELECT REM PT RETURN 15FT ADLT (MISCELLANEOUS) ×2 IMPLANT
GLOVE BIOGEL PI IND STRL 7.0 (GLOVE) ×1 IMPLANT
GLOVE BIOGEL PI INDICATOR 7.0 (GLOVE) ×1
GLOVE SURG SS PI 7.0 STRL IVOR (GLOVE) ×2 IMPLANT
GLOVE SURG SS PI 7.5 STRL IVOR (GLOVE) ×2 IMPLANT
GLOVE SURG SS PI 8.0 STRL IVOR (GLOVE) ×4 IMPLANT
GOWN STRL REUS W/TWL XL LVL3 (GOWN DISPOSABLE) ×4 IMPLANT
HEMOSTAT SPONGE AVITENE ULTRA (HEMOSTASIS) IMPLANT
IV CATH 14GX2 1/4 (CATHETERS) ×2 IMPLANT
KIT BASIN OR (CUSTOM PROCEDURE TRAY) ×2 IMPLANT
KIT POSITIONING SURG ANDREWS (MISCELLANEOUS) ×2 IMPLANT
MANIFOLD NEPTUNE II (INSTRUMENTS) ×2 IMPLANT
NEEDLE SPNL 18GX3.5 QUINCKE PK (NEEDLE) ×4 IMPLANT
PACK LAMINECTOMY ORTHO (CUSTOM PROCEDURE TRAY) ×2 IMPLANT
PATTIES SURGICAL .5 X.5 (GAUZE/BANDAGES/DRESSINGS) IMPLANT
PATTIES SURGICAL .75X.75 (GAUZE/BANDAGES/DRESSINGS) IMPLANT
PATTIES SURGICAL 1X1 (DISPOSABLE) IMPLANT
RUBBERBAND STERILE (MISCELLANEOUS) ×4 IMPLANT
SPONGE SURGIFOAM ABS GEL 100 (HEMOSTASIS) ×2 IMPLANT
STAPLER VISISTAT (STAPLE) IMPLANT
STRIP CLOSURE SKIN 1/2X4 (GAUZE/BANDAGES/DRESSINGS) ×2 IMPLANT
SUT NURALON 4 0 TR CR/8 (SUTURE) IMPLANT
SUT PROLENE 3 0 PS 2 (SUTURE) ×2 IMPLANT
SUT VIC AB 1 CT1 27 (SUTURE) ×2
SUT VIC AB 1 CT1 27XBRD ANTBC (SUTURE) ×2 IMPLANT
SUT VIC AB 1-0 CT2 27 (SUTURE) IMPLANT
SUT VIC AB 2-0 CT1 27 (SUTURE) ×1
SUT VIC AB 2-0 CT1 TAPERPNT 27 (SUTURE) ×1 IMPLANT
SUT VIC AB 2-0 CT2 27 (SUTURE) ×2 IMPLANT
SYR 3ML LL SCALE MARK (SYRINGE) IMPLANT
TOWEL OR 17X26 10 PK STRL BLUE (TOWEL DISPOSABLE) ×2 IMPLANT
TOWEL OR NON WOVEN STRL DISP B (DISPOSABLE) ×2 IMPLANT
YANKAUER SUCT BULB TIP NO VENT (SUCTIONS) IMPLANT

## 2017-04-06 NOTE — Discharge Instructions (Signed)

## 2017-04-06 NOTE — Interval H&P Note (Signed)
History and Physical Interval Note:  04/06/2017 10:39 AM  Devin Gilmore  has presented today for surgery, with the diagnosis of HNP L4-5  The various methods of treatment have been discussed with the patient and family. After consideration of risks, benefits and other options for treatment, the patient has consented to  Procedure(s) with comments: Microlumbar decompression L4-5 Bilateral  (Bilateral) - Requests for 2 hrs as a surgical intervention .  The patient's history has been reviewed, patient examined, no change in status, stable for surgery.  I have reviewed the patient's chart and labs.  Questions were answered to the patient's satisfaction.     Maygen Sirico C

## 2017-04-06 NOTE — Progress Notes (Signed)
Pharmacy - Brief Note   Vancomycin post-op  51 YOM s/p HNP L4-5.  Pharmacy asked to dose vancomycin for post-op prophylaxis.  No documentation found that patient has drain. RN aware of drain as well.  Per consult comments give dose of vanco x 1 post-op  Plan:  Vancomycin 1gm IV x 1 12h from pre-op dose  Pharmacy will sign off as no further dosing/monitoring required'  Thanks, Juliette Alcide, PharmD, BCPS.   Pager: [phone] 04/06/2017 3:44 PM

## 2017-04-06 NOTE — Transfer of Care (Signed)
Immediate Anesthesia Transfer of Care Note  Patient: Devin Gilmore  Procedure(s) Performed: Procedure(s) with comments: Microlumbar decompression L4-5 Bilateral  (Bilateral) - Requests for 2 hrs  Patient Location: PACU  Anesthesia Type:General  Level of Consciousness: awake, alert , oriented and patient cooperative  Airway & Oxygen Therapy: Patient Spontanous Breathing and Patient connected to face mask oxygen  Post-op Assessment: Report given to RN and Post -op Vital signs reviewed and stable  Post vital signs: Reviewed and stable  Last Vitals:  Vitals:   04/06/17 0902  BP: (!) 155/82  Pulse: 66  Resp: 18  Temp: 36.4 C    Last Pain:  Vitals:   04/06/17 0929  TempSrc:   PainSc: 1       Patients Stated Pain Goal: 3 (04/06/17 0929)  Complications: No apparent anesthesia complications

## 2017-04-06 NOTE — Anesthesia Procedure Notes (Signed)
Procedure Name: Intubation Date/Time: 04/06/2017 11:31 AM Performed by: Dione Booze Pre-anesthesia Checklist: Emergency Drugs available, Suction available, Patient being monitored and Patient identified Patient Re-evaluated:Patient Re-evaluated prior to inductionOxygen Delivery Method: Circle system utilized Preoxygenation: Pre-oxygenation with 100% oxygen Intubation Type: IV induction Ventilation: Mask ventilation without difficulty Laryngoscope Size: Mac and 4 Grade View: Grade II Tube type: Oral Tube size: 7.5 mm Number of attempts: 1 Airway Equipment and Method: Stylet Placement Confirmation: ETT inserted through vocal cords under direct vision,  breath sounds checked- equal and bilateral and positive ETCO2 Secured at: 22 cm Tube secured with: Tape Dental Injury: Teeth and Oropharynx as per pre-operative assessment

## 2017-04-06 NOTE — H&P (View-Only) (Signed)
Devin Gilmore is an 51 y.o. male.   Chief Complaint: back and leg pain HPI: The patient is a 51 year old male who presents today for follow up of their back. The patient is being followed for their low back symptoms. They are now 6 1/2 months out from injury (DOI 08/09/16). Symptoms reported today include: pain. Current treatment includes: activity modification, NSAIDs and pain medications. The following medication has been used for pain control: antiinflammatory medication, Tylenol and gabapentin. The patient presents today following CT/Myelogram and EMG/NCV results. Note for "Follow-up back": The patient was placed on a 5lb. lifting restriction. NCM is [name].  Devin Gilmore follows up with case manager [name]. He is six and a half months status post his injury and continues to report pain radiating down into his right leg when he sits or when he stands too long. It radiates down into the bottom of his foot. He has also undergone his EMG and nerve conduction study.  His myelogram indicates disc herniation at L4-5 central and to the left. Postsurgical changes at L3-4, retrolisthesis of L5 on S1. There is a small disc protrusion at L5-S1 to the right.  His MRI demonstrated disc herniations at L4-5 to the right and into the left.  Past Medical History:  Diagnosis Date  . Heart murmur    echo 05 no problems    Past Surgical History:  Procedure Laterality Date  . BACK SURGERY  2010  . SPINAL CORD DECOMPRESSION  12/25/2014   L3  L4   Dr Yevette Edwards    No family history on file. Social History:  reports that he has never smoked. He has never used smokeless tobacco. He reports that he does not drink alcohol or use drugs.  Allergies:  Allergies  Allergen Reactions  . Amoxicillin      (Not in a hospital admission)  No results found for this or any previous visit (from the past 48 hour(s)). No results found.  Review of Systems  Constitutional: Negative.   HENT:  Negative.   Eyes: Negative.   Respiratory: Negative.   Cardiovascular: Negative.   Gastrointestinal: Negative.   Genitourinary: Negative.   Musculoskeletal: Positive for back pain.  Skin: Negative.   Neurological: Positive for sensory change and focal weakness.    There were no vitals taken for this visit. Physical Exam  Constitutional: He is oriented to person, place, and time. He appears well-developed and well-nourished.  HENT:  Head: Normocephalic.  Eyes: Pupils are equal, round, and reactive to light.  Neck: Normal range of motion.  Cardiovascular: Normal rate.   Respiratory: Effort normal.  GI: Soft.  Musculoskeletal:  On exam, he is in moderate distress. Straight leg raises, buttock and thigh pain on the right, negative on the left. Trace EHL weakness. Pain with extension.  Lumbar spine exam reveals no evidence of soft tissue swelling, deformity or skin ecchymosis. On palpation there is no tenderness of the lumbar spine. No flank pain with percussion. The abdomen is soft and nontender. Nontender over the trochanters. No cellulitis or lymphadenopathy.  Good range of motion of the lumbar spine without associated pain. Straight leg raise is negative. Motor is 5/5 including EHL, tibialis anterior, plantar flexion, quadriceps and hamstrings. Patient is normoreflexic. There is no Babinski or clonus. Sensory exam is intact to light touch. Patient has good distal pulses. No DVT. No pain and normal range of motion without instability of the hips, knees and ankles.  Neurological: He is alert and oriented  to person, place, and time.    Myelogram was reviewed. On the lateral, there is attenuation in extension of the dye column. On the AP, there is a near absence of dye at the level of L4-5. Paracentral protrusion is noted at L4-5 to the left and mild to the right.  Again, MRI demonstrates lateral recess stenosis bilaterally with disc herniation at L4-5 to the right and L4-5 to the  left.  Assessment/Plan Right lower extremity radicular pain secondary to lateral recess stenosis at L4-5 with disc protrusion at L4-5 paracentral to the right and left refractory to rest, activity modification, therapy, epidural steroid injection, with an EMG and nerve conduction study that is unremarkable ruling out diabetic neuropathy. The patient is an underlying diabetic.  We discussed options at this point in time, live with his symptoms utilizing pharmacologic management including gabapentin, Mobic and/or Tylenol as well as activity restriction. Second option is to consider a decompression at L4-5. I would recommend bilaterally given the pathology noted by myelogram and by MRI.  I had an extensive discussion of the risks and benefits of the lumbar decompression with the patient including bleeding, infection, damage to neurovascular structures, epidural fibrosis, CSF leak requiring repair. We also discussed increase in pain, adjacent segment disease, recurrent disc herniation, need for future surgery including repeat decompression and/or fusion. We also discussed risks of postoperative hematoma, paralysis, anesthetic complications including DVT, PE, death, cardiopulmonary dysfunction. In addition, the perioperative and postoperative courses were discussed in detail including the rehabilitative time and return to functional activity and work. I provided the patient with an illustrated handout and utilized the appropriate surgical models.  We did, however, discuss the possibility of a recurrent disc herniation, need for fusion in the future as he experienced at L3-4. I do not feel his L3-4 fusion is a factor here. He has a small protrusion at L5-S1, but no evidence of a neural compressive lesion. We discussed overnight in the hospital, sutures out in 2 weeks, 6 weeks until light duty as an option, 12 weeks until he is at maximal medical improvement and would rate and release him.  His main challenge is  going to be his requirement to have to bend and twist, which will abnormally load the disc. I discussed this separately and in front of the patient with [name], his case manager. He is unsure as to what his last A1c was. His medical physician is Dr. Trula Slade. We will have him kindly submit a preoperative clearance. Continue light duty, no lifting over 5 pounds in the interim. I gave him a prescription of Mobic, GI side effects discussed.  Plan microlumbar decompression L4-5 bilateral  Dorothy Spark., PA-C for Dr. Shelle Iron 03/21/2017, 8:44 AM

## 2017-04-06 NOTE — Brief Op Note (Signed)
04/06/2017  1:01 PM  PATIENT:  Devin Gilmore  51 y.o. male  PRE-OPERATIVE DIAGNOSIS:  HNP L4-5  POST-OPERATIVE DIAGNOSIS:  HNP L4-5  PROCEDURE:  Procedure(s) with comments: Microlumbar decompression L4-5 Bilateral  (Bilateral) - Requests for 2 hrs  SURGEON:  Surgeon(s) and Role:    * Jene Every, MD - Primary  PHYSICIAN ASSISTANT:   ASSISTANTS: Bissell   ANESTHESIA:   general  EBL:  Total I/O In: 400 [I.V.:400] Out: 50 [Blood:50]  BLOOD ADMINISTERED:none  DRAINS: none   LOCAL MEDICATIONS USED:  MARCAINE     SPECIMEN:  No Specimen  DISPOSITION OF SPECIMEN:  N/A  COUNTS:  YES  TOURNIQUET:  * No tourniquets in log *  DICTATION: .Other Dictation: Dictation Number [id number]  PLAN OF CARE: Admit for overnight observation  PATIENT DISPOSITION:  PACU - hemodynamically stable.   Delay start of Pharmacological VTE agent (>24hrs) due to surgical blood loss or risk of bleeding: yes

## 2017-04-06 NOTE — Anesthesia Preprocedure Evaluation (Addendum)
Anesthesia Evaluation  Patient identified by MRN, date of birth, ID band Patient awake    Reviewed: Allergy & Precautions, NPO status , Patient's Chart, lab work & pertinent test results  Airway Mallampati: II  TM Distance: >3 FB Neck ROM: Full    Dental  (+) Teeth Intact, Dental Advisory Given   Pulmonary neg pulmonary ROS,    breath sounds clear to auscultation       Cardiovascular hypertension, Pt. on medications negative cardio ROS   Rhythm:Regular Rate:Normal     Neuro/Psych  Neuromuscular disease negative psych ROS   GI/Hepatic negative GI ROS, Neg liver ROS,   Endo/Other  diabetes, Type 2, Oral Hypoglycemic Agents  Renal/GU negative Renal ROS  negative genitourinary   Musculoskeletal negative musculoskeletal ROS (+)   Abdominal   Peds negative pediatric ROS (+)  Hematology negative hematology ROS (+)   Anesthesia Other Findings   Reproductive/Obstetrics negative OB ROS                            Lab Results  Component Value Date   WBC 4.5 03/31/2017   HGB 13.8 03/31/2017   HCT 41.5 03/31/2017   MCV 76.6 (L) 03/31/2017   PLT 275 03/31/2017   Lab Results  Component Value Date   CREATININE 1.01 03/31/2017   BUN 14 03/31/2017   NA 138 03/31/2017   K 4.1 03/31/2017   CL 107 03/31/2017   CO2 27 03/31/2017   Lab Results  Component Value Date   INR 0.96 12/16/2014   EKG: normal sinus rhythm, left fascicular block.   Anesthesia Physical Anesthesia Plan  ASA: II  Anesthesia Plan: General   Post-op Pain Management:    Induction: Intravenous  Airway Management Planned: Oral ETT  Additional Equipment:   Intra-op Plan:   Post-operative Plan: Extubation in OR  Informed Consent: I have reviewed the patients History and Physical, chart, labs and discussed the procedure including the risks, benefits and alternatives for the proposed anesthesia with the patient or  authorized representative who has indicated his/her understanding and acceptance.   Dental advisory given  Plan Discussed with: CRNA  Anesthesia Plan Comments:         Anesthesia Quick Evaluation

## 2017-04-06 NOTE — Anesthesia Postprocedure Evaluation (Addendum)
Anesthesia Post Note  Patient: Devin Gilmore  Procedure(s) Performed: Procedure(s) (LRB): Microlumbar decompression L4-5 Bilateral  (Bilateral)  Patient location during evaluation: PACU Anesthesia Type: General Level of consciousness: awake and alert Pain management: pain level controlled Vital Signs Assessment: post-procedure vital signs reviewed and stable Respiratory status: spontaneous breathing, nonlabored ventilation, respiratory function stable and patient connected to nasal cannula oxygen Cardiovascular status: blood pressure returned to baseline and stable Postop Assessment: no signs of nausea or vomiting Anesthetic complications: no       Last Vitals:  Vitals:   04/06/17 1430 04/06/17 1457  BP: (!) 143/85 135/71  Pulse: 61 66  Resp: 12 12  Temp: 36.7 C 36.7 C    Last Pain:  Vitals:   04/06/17 1457  TempSrc: Oral  PainSc: 3                  Shelton Silvas

## 2017-04-07 ENCOUNTER — Encounter (HOSPITAL_COMMUNITY): Payer: Self-pay | Admitting: Specialist

## 2017-04-07 DIAGNOSIS — M48061 Spinal stenosis, lumbar region without neurogenic claudication: Secondary | ICD-10-CM | POA: Diagnosis not present

## 2017-04-07 LAB — GLUCOSE, CAPILLARY
GLUCOSE-CAPILLARY: 187 mg/dL — AB (ref 65–99)
Glucose-Capillary: 141 mg/dL — ABNORMAL HIGH (ref 65–99)

## 2017-04-07 MED ORDER — MELOXICAM 7.5 MG PO TABS: 7.5000 mg | ORAL_TABLET | Freq: Two times a day (BID) | ORAL | Status: DC

## 2017-04-07 NOTE — Progress Notes (Signed)
Subjective: 1 Day Post-Op Procedure(s) (LRB): Microlumbar decompression L4-5 Bilateral  (Bilateral) Patient reports pain as mild. Reports incisional back pain. Legs feel okay now lying in bed but he has not been up much yet (which is when they were most symptomatic pre-op). Tolerated walking well last night. No other c/o. Voiding without difficulty. Feels he is able to go home today.   Objective: Vital signs in last 24 hours: Temp:  [97.6 F (36.4 C)-99 F (37.2 C)] 98.3 F (36.8 C) (04/12 4098) Pulse Rate:  [61-75] 65 (04/12 0608) Resp:  [6-18] 16 (04/12 0608) BP: (131-155)/(71-92) 133/73 (04/12 0608) SpO2:  [98 %-100 %] 99 % (04/12 0608) Weight:  [101.6 kg (224 lb)] 101.6 kg (224 lb) (04/11 1457)  Intake/Output from previous day: 04/11 0701 - 04/12 0700 In: 2618.3 [P.O.:840; I.V.:1523.3; IV Piggyback:255] Out: 1930 [Urine:1880; Blood:50] Intake/Output this shift: No intake/output data recorded.  No results for input(s): HGB in the last 72 hours. No results for input(s): WBC, RBC, HCT, PLT in the last 72 hours. No results for input(s): NA, K, CL, CO2, BUN, CREATININE, GLUCOSE, CALCIUM in the last 72 hours. No results for input(s): LABPT, INR in the last 72 hours.  Neurologically intact ABD soft Neurovascular intact Sensation intact distally Intact pulses distally Dorsiflexion/Plantar flexion intact Incision: dressing C/D/I and no drainage No cellulitis present Compartment soft no calf pain or sign of DVT  Assessment/Plan: 1 Day Post-Op Procedure(s) (LRB): Microlumbar decompression L4-5 Bilateral  (Bilateral) Advance diet  Up with therapy Discussed Lspine precautions, dressing instructions, D/C instructions D/C home today Will discuss with Dr. Elissa Lovett, Dayna Barker. 04/07/2017, 7:59 AM

## 2017-04-07 NOTE — Discharge Summary (Signed)
Physician Discharge Summary   Patient ID: Devin Gilmore MRN: [id number] DOB/AGE: Jan 20, 1966 51 y.o.  Admit date: 04/06/2017 Discharge date: 04/07/2017  Primary Diagnosis:   HNP L4-5  Admission Diagnoses:  Past Medical History:  Diagnosis Date  . Diabetes mellitus without complication ([place])   . Heart murmur    echo 05 no problems   Discharge Diagnoses:   Principal Problem:   HNP (herniated nucleus pulposus), lumbar Active Problems:   Spinal stenosis at L4-L5 level  Procedure:  Procedure(s) (LRB): Microlumbar decompression L4-5 Bilateral  (Bilateral)   Consults: None  HPI:  see H&P    Laboratory Data: Hospital Outpatient Visit on 03/31/2017  Component Date Value Ref Range Status  . Sodium 03/31/2017 138  135 - 145 mmol/L Final  . Potassium 03/31/2017 4.1  3.5 - 5.1 mmol/L Final  . Chloride 03/31/2017 107  101 - 111 mmol/L Final  . CO2 03/31/2017 27  22 - 32 mmol/L Final  . Glucose, Bld 03/31/2017 125* 65 - 99 mg/dL Final  . BUN 03/31/2017 14  6 - 20 mg/dL Final  . Creatinine, Ser 03/31/2017 1.01  0.61 - 1.24 mg/dL Final  . Calcium 03/31/2017 9.3  8.9 - 10.3 mg/dL Final  . GFR calc non Af Amer 03/31/2017 >60  >60 mL/min Final  . GFR calc Af Amer 03/31/2017 >60  >60 mL/min Final   Comment: (NOTE) The eGFR has been calculated using the CKD EPI equation. This calculation has not been validated in all clinical situations. eGFR's persistently <60 mL/min signify possible Chronic Kidney Disease.   . Anion gap 03/31/2017 4* 5 - 15 Final  . WBC 03/31/2017 4.5  4.0 - 10.5 K/uL Final  . RBC 03/31/2017 5.42  4.22 - 5.81 MIL/uL Final  . Hemoglobin 03/31/2017 13.8  13.0 - 17.0 g/dL Final  . HCT 03/31/2017 41.5  39.0 - 52.0 % Final  . MCV 03/31/2017 76.6* 78.0 - 100.0 fL Final  . MCH 03/31/2017 25.5* 26.0 - 34.0 pg Final  . MCHC 03/31/2017 33.3  30.0 - 36.0 g/dL Final  . RDW 03/31/2017 13.3  11.5 - 15.5 % Final  . Platelets 03/31/2017 275  150 - 400 K/uL Final  . MRSA,  PCR 03/31/2017 NEGATIVE  NEGATIVE Final  . Staphylococcus aureus 03/31/2017 NEGATIVE  NEGATIVE Final   Comment:        The Xpert SA Assay (FDA approved for NASAL specimens in patients over 20 years of age), is one component of a comprehensive surveillance program.  Test performance has been validated by [employer] for patients greater than or equal to 75 year old. It is not intended to diagnose infection nor to guide or monitor treatment.   . Glucose-Capillary 03/31/2017 153* 65 - 99 mg/dL Final   No results for input(s): HGB in the last 72 hours. No results for input(s): WBC, RBC, HCT, PLT in the last 72 hours. No results for input(s): NA, K, CL, CO2, BUN, CREATININE, GLUCOSE, CALCIUM in the last 72 hours. No results for input(s): LABPT, INR in the last 72 hours.  X-Rays:Dg Lumbar Spine 2-3 Views  Result Date: 03/31/2017 CLINICAL DATA:  Lumbar spine surgery.  Right leg pain. EXAM: LUMBAR SPINE - 2-3 VIEW COMPARISON:  12/25/2014. FINDINGS: Lumbar spine numbered as per prior exam of 12/25/2014 . L3-L4 posterior and interbody fusion. Hardware intact. Anatomic alignment. Mild lumbar scoliosis. No acute bony abnormality . IMPRESSION: L3-L4 posterior interbody fusion. Hardware intact. No acute abnormality. Mild scoliosis. Exam stable from prior exam. Electronically Signed  By: [place]   On: 03/31/2017 13:32   Dg Spine Portable 1 View  Result Date: 04/06/2017 CLINICAL DATA:  Surgical level L4-5 EXAM: PORTABLE SPINE - 1 VIEW COMPARISON:  04/06/2017 FINDINGS: L3-4 PLIF. Surgical probes posterior to L4-5. IMPRESSION: Lumbar localization as above. Electronically Signed   By: Julian Hy M.D.   On: 04/06/2017 13:04   Dg Spine Portable 1 View  Result Date: 04/06/2017 CLINICAL DATA:  Laminectomy EXAM: PORTABLE SPINE - 1 VIEW COMPARISON:  Study obtained earlier in the day FINDINGS: Cross-table lateral lumbar image labeled #2 submitted. Metallic probe with is posterior to the  midportion of the L5 vertebral body. Patient is undergone previous posterior screw and plate fixation at L3 and L4 with a disc spacer at L3-4. There is disc space narrowing at L3-4. Other disc spaces which are visualized appear unremarkable. IMPRESSION: Metallic probe tip is posterior to the midportion of the L5 vertebral body. Postoperative change at L3 and L4 noted. Electronically Signed   By: Lowella Grip III M.D.   On: 04/06/2017 12:05   Dg Spine Portable 1 View  Result Date: 04/06/2017 CLINICAL DATA:  Surgical level L4-5 EXAM: PORTABLE SPINE - 1 VIEW COMPARISON:  03/31/2017 FINDINGS: Single cross-table lateral view of the lumbar spine was obtained in the operating room Pedicle screw and interbody fusion L3-4 Localizer needle in the posterior soft tissues superficial to the L3-4 interspinous space Second localizer needle in the posterior soft tissues superficial to the L4-5 interspinous space IMPRESSION: Surgical localization as above. Electronically Signed   By: Franchot Gallo M.D.   On: 04/06/2017 11:51    EKG: Orders placed or performed during the hospital encounter of 03/31/17  . EKG 12 lead  . EKG 12 lead     Hospital Course: Patient was admitted to [facility] and taken to the OR and underwent the above state procedure without complications.  Patient tolerated the procedure well and was later transferred to the recovery room and then to the orthopaedic floor for postoperative care.  They were given PO and IV analgesics for pain control following their surgery.  They were given 24 hours of postoperative antibiotics.   PT was consulted postop to assist with mobility and transfers.  The patient was allowed to be WBAT with therapy and was taught back precautions. Discharge planning was consulted to help with postop disposition and equipment needs.  Patient had a good night on the evening of surgery and started to get up OOB with therapy on day one. Patient was seen in rounds and was  ready to go home on day one.  They were given discharge instructions and dressing directions.  They were instructed on when to follow up in the office with Dr. Tonita Cong.   Diet: Diabetic diet Activity:WBAT; Lspine precautions Follow-up:in 10-14 days Disposition - Home Discharged Condition: good   Discharge Instructions    Call MD / Call 911    Complete by:  As directed    If you experience chest pain or shortness of breath, CALL 911 and be transported to the hospital emergency room.  If you develope a fever above 101 F, pus (white drainage) or increased drainage or redness at the wound, or calf pain, call your surgeon's office.   Constipation Prevention    Complete by:  As directed    Drink plenty of fluids.  Prune juice may be helpful.  You may use a stool softener, such as Colace (over the counter) 100 mg twice a  day.  Use MiraLax (over the counter) for constipation as needed.   Diet - low sodium heart healthy    Complete by:  As directed    Increase activity slowly as tolerated    Complete by:  As directed      Allergies as of 04/07/2017      Reactions   Amoxicillin Anaphylaxis   Has patient had a PCN reaction causing immediate rash, facial/tongue/throat swelling, SOB or lightheadedness with hypotension: Yes Has patient had a PCN reaction causing severe rash involving mucus membranes or skin necrosis: No Has patient had a PCN reaction that required hospitalization No Has patient had a PCN reaction occurring within the last 10 years: Yes If all of the above answers are "NO", then may proceed with Cephalosporin use.      Medication List    STOP taking these medications   hydrochlorothiazide 25 MG tablet Commonly known as:  HYDRODIURIL   naproxen 500 MG tablet Commonly known as:  NAPROSYN     TAKE these medications   acetaminophen 500 MG tablet Commonly known as:  TYLENOL Take 1,000 mg by mouth every 8 (eight) hours as needed for mild pain.   docusate sodium 100 MG  capsule Commonly known as:  COLACE Take 1 capsule (100 mg total) by mouth 2 (two) times daily as needed for mild constipation.   gabapentin 300 MG capsule Commonly known as:  NEURONTIN Take 300 mg by mouth 3 (three) times daily.   glimepiride 4 MG tablet Commonly known as:  AMARYL Take 4 mg by mouth daily with breakfast.   lisinopril 10 MG tablet Commonly known as:  PRINIVIL,ZESTRIL Take 10 mg by mouth daily.   meloxicam 7.5 MG tablet Commonly known as:  MOBIC Take 1 tablet (7.5 mg total) by mouth 2 (two) times daily. May resume 5 days post-op as needed What changed:  additional instructions   metFORMIN 500 MG 24 hr tablet Commonly known as:  GLUCOPHAGE-XR Take 1 tablet (500 mg total) by mouth daily with breakfast. What changed:  how much to take  when to take this   methocarbamol 500 MG tablet Commonly known as:  ROBAXIN Take 1 tablet (500 mg total) by mouth every 6 (six) hours as needed for muscle spasms.   oxyCODONE-acetaminophen 5-325 MG tablet Commonly known as:  PERCOCET Take 1-2 tablets by mouth every 4 (four) hours as needed for severe pain. What changed:  when to take this   polyethylene glycol packet Commonly known as:  MIRALAX / GLYCOLAX Take 17 g by mouth daily.      Follow-up Information    BEANE,JEFFREY C, MD Follow up in 2 week(s).   Specialty:  Orthopedic Surgery Contact information: [address] [place] [address] [phone]        Johnn Hai, MD In 2 weeks.   Specialty:  Orthopedic Surgery Contact information: [address] [place] [address] [phone]           Signed: Lacie Draft, PA-C Orthopaedic Surgery 04/07/2017, 8:02 AM

## 2017-04-07 NOTE — Progress Notes (Signed)
Discharge instructions discussed with patient and family, verbalized agreement and understanding.  Prescriptions given to patient 

## 2017-04-07 NOTE — Op Note (Signed)
NAME:  Devin Gilmore, Devin Gilmore NO.:  MEDICAL RECORD NO.:  [id number]  LOCATION:                                 FACILITY:  PHYSICIAN:  Jene Every, M.D.         DATE OF BIRTH:  DATE OF PROCEDURE:  04/06/2017 DATE OF DISCHARGE:                              OPERATIVE REPORT   PREOPERATIVE DIAGNOSIS:  Spinal stenosis bilaterally at L4-5.  POSTOPERATIVE DIAGNOSIS:  Spinal stenosis bilaterally at L4-5.  PROCEDURE PERFORMED: 1. Bilateral hemilaminotomy at L4-5. 2. Bilateral foraminotomies at L4-L5.  ANESTHESIA:  General.  ASSISTANT:  Lanna Poche, PA.  TECHNICAL DIFFICULTY:  Increased due to the patient's adjacent segment L3-4 previous lumbar fusion with extensive fibrosis.  HISTORY:  A 51 year old, injured at work.  Bilateral lower extremity radicular pain, right greater than left; EHL weakness; neural tension signs, partial relief from nerve block, failing conservative treatment. MRI and myelogram.  Myelogram indicating the spinal stenosis in extension with decreased filling, noted in extension in the standing position.  Particularly on the right, he was indicated for decompression of the lateral recesses bilaterally, evaluation of the disk and foraminotomies, decompression of L5 root.  Risks and benefits were discussed including bleeding, infection, damage to the neurovascular structures, no change in symptoms, worsening symptoms, DVT, PE, anesthetic complications, etc.  Also a technical difficulty increased due to the patient's elevated BMI.  TECHNIQUE:  The patient in supine position, after induction of adequate general anesthesia and a gram of vancomycin, was placed prone on the [place] frame.  All bony prominences were well padded.  Lumbar region was prepped and draped in usual sterile fashion.  Previous surgical incision was from L2 to the sacrum.  I incised the 2 cm area in the midportion of this incision.  Over the L4-5 interspace,  subcutaneous tissue was dissected.  Electrocautery was utilized to achieve hemostasis.  The dorsolumbar fascia was noted to be detached from the spinous processes, divided dorsolumbar fascia, residual in line with the skin incision.  Paraspinous muscles elevated from lamina of L4-5 bilaterally.  Extra long retractors were placed.  McCullough retractor was placed.  Operating microscope was draped and brought on the surgical field.  I turned my attention first towards the left.  Micro straight curette was utilized to detach the ligamentum flavum from the cephalad edge of L5.  Performed hemilaminotomy using caudad edge of L4 to the L4 pedicle to the point detaching the ligamentum flavum from his caudad edge.  Neuro patty placed beneath the ligamentum flavum as well as a Facilities manager.  I identified the L5 root distally within the foramen, gently mobilized it medially, performed a foraminotomy of L5 and decompressed in the lateral recess in the medial border of the pedicle with 2 mm [place].  Significant lateral recess stenosis was noted here.  I performed a foraminotomy of L4 protecting the L4 root, disk was evaluated.  No herniation.  There was an epidural venous plexus, which was lysed as well.  Following this, a neural probe passed freely up the foramen of L4 and L5.  We irrigated it copiously.  No evidence of CSF leakage or active bleeding and placed thrombin-soaked  Gelfoam in this laminotomy defect.  I turned my attention then towards the right through a separate fascial incision.  I then elevated paraspinous muscles at L4 and L5 and placed the McCullough retractor on the right.  Placed the operating microscope from the right side of the operating room table and again performed the hemilaminotomy of the caudad edge of L4 to detach the ligamentum flavum.  Straight microcurette utilized to detach ligamentum flavum from the cephalad edge of L5 root.  Neuro patty placed beneath the  ligamentum flavum performed generous foraminotomy of L5, gently mobilized the L5 root medially was noted to be compressed in the lateral recess.  I decompressed the lateral recess in the medial border of the pedicle.  Facet hypertrophy was noted bilaterally.  We performed the foraminotomy of L4 as well.  I examined the disk.  It was somewhat hardened, that had been small, but it did not feel that there was any significant disk herniation requiring diskectomy.  I feel that his stenosis was the main contributor as he had epidural venous plexus noted as well.  This was cauterized and lysed. Neuro probe passed freely up the foramen of L4 and of L5.  The L5 root had 1 cm of excursion of the medial pedicle without tension.  I checked beneath thecal sac, the axilla of the root, shoulder, and the foramen. No disk herniation was noted.  This was checked on the contralateral side as well.  Confirmatory radiograph obtained with [place] at the disk space at L4 and L5 and had a mark at foramen of L5.  Copiously irrigated the wound with antibiotic irrigation.  Good restoration of thecal sac.  No active bleeding or CSF leakage.  Placed thrombin-soaked Gelfoam in laminotomy defect.  I felt we had encountered the pathology consistent with his symptoms and it was adequate and it was appropriately addressed.  I irrigated the paraspinous musculature.  No active bleeding.  Closed as best possible.  The dorsolumbar fascia in line with the skin incision and in the remaining interspinous ligament. Subcu with 2-0 and skin with staples.  Wound was dressed sterilely. Placed supine on the hospital bed, extubated without difficulty, and transported to the recovery room in satisfactory condition.  The patient tolerated the procedure well.  No complications.  Assistant, Lanna Poche, [place].  Minimal blood loss.     Jene Every, M.D.     [name]  D:  04/06/2017  T:  04/07/2017  Job:  [id number]

## 2017-04-07 NOTE — Care Management Note (Signed)
Case Management Note  Patient Details  Name: Devin Gilmore MRN: [id number] Date of Birth: [birth year]  Subjective/Objective:     51 yo admitted with HNP L4-5               Action/Plan: Pt from home with spouse. Pt has RW, 3in1 and shower chair at home. No other CM needs communicated.  Expected Discharge Date:  04/07/17               Expected Discharge Plan:  Home/Self Care  In-House Referral:     Discharge planning Services  CM Consult  Post Acute Care Choice:    Choice offered to:     DME Arranged:    DME Agency:     HH Arranged:    HH Agency:     Status of Service:  Completed, signed off  If discussed at [employer] of Stay Meetings, dates discussed:    Additional CommentsBartholome Bill, RN 04/07/2017, 11:20 AM  [phone]

## 2017-04-07 NOTE — Evaluation (Signed)
Occupational Therapy Evaluation Patient Details Name: Devin Gilmore MRN: [id number] DOB: [birth year] Today's Date: 04/07/2017    History of Present Illness Pt admitted for L4-5 decompression.  h/o previous back sxs   Clinical Impression   This 51 year old man was admitted for the above sx. All education was completed. No further OT is needed at this time    Follow Up Recommendations  No OT follow up    Equipment Recommendations  None recommended by OT    Recommendations for Other Services       Precautions / Restrictions Precautions Precautions: Back;Fall Restrictions Weight Bearing Restrictions: No      Mobility Bed Mobility               General bed mobility comments: supervision and cues for sidelying to sit  Transfers Overall transfer level: Needs assistance Equipment used: Rolling walker (2 wheeled) Transfers: Sit to/from Stand Sit to Stand: Supervision         General transfer comment: cues for UE placement    Balance                                           ADL either performed or assessed with clinical judgement   ADL Overall ADL's : Needs assistance/impaired Eating/Feeding: Independent   Grooming: Oral care;Supervision/safety;Standing   Upper Body Bathing: Set up;Sitting   Lower Body Bathing: Supervison/ safety;Sit to/from stand;Adhering to back precautions;With adaptive equipment   Upper Body Dressing : Set up;Sitting   Lower Body Dressing: Moderate assistance;Sit to/from stand;Adhering to back precautions;With adaptive equipment Lower Body Dressing Details (indicate cue type and reason): assist for socks Toilet Transfer: Supervision/safety;BSC;RW;Ambulation   Toileting- Clothing Manipulation and Hygiene: Supervision/safety;Sit to/from stand;Adhering to back precautions;With adaptive equipment   Tub/ Shower Transfer: Walk-in shower;Min guard;Ambulation     General ADL Comments: reviewed adls and back  precautions     Vision         Perception     Praxis      Pertinent Vitals/Pain Pain Assessment: 0-10 Pain Score: 2  Pain Location: back Pain Descriptors / Indicators: Sore Pain Intervention(s): Limited activity within patient's tolerance;Repositioned;Monitored during session;Premedicated before session     Hand Dominance     Extremity/Trunk Assessment Upper Extremity Assessment Upper Extremity Assessment: Overall WFL for tasks assessed           Communication Communication Communication: No difficulties   Cognition Arousal/Alertness: Awake/alert Behavior During Therapy: WFL for tasks assessed/performed Overall Cognitive Status: Within Functional Limits for tasks assessed                                     General Comments       Exercises     Shoulder Instructions      Home Living Family/patient expects to be discharged to:: Private residence   Available Help at Discharge: Family;Available PRN/intermittently Type of Home: House             Bathroom Shower/Tub: Walk-in Human resources officer: Standard     Home Equipment: Emergency planning/management officer - 2 wheels;Bedside commode   Additional Comments: reacher, long sponge, long shoehorn, toilet aide      Prior Functioning/Environment Level of Independence: Independent                 OT  Problem List:        OT Treatment/Interventions:      OT Goals(Current goals can be found in the care plan section) Acute Rehab OT Goals Patient Stated Goal: return to independence  OT Frequency:     Barriers to D/C:            Co-evaluation              End of Session    Activity Tolerance: Patient tolerated treatment well Patient left: in chair;with call bell/phone within reach  OT Visit Diagnosis: Muscle weakness (generalized) (M62.81)                Time: 1610-9604 OT Time Calculation (min): 22 min Charges:  OT General Charges $OT Visit: 1 Procedure OT Evaluation $OT  Eval Low Complexity: 1 Procedure G-Codes: OT G-codes **NOT FOR INPATIENT CLASS** Functional Assessment Tool Used: Clinical judgement Functional Limitation: Self care Self Care Current Status (V4098): At least 20 percent but less than 40 percent impaired, limited or restricted Self Care Goal Status (J1914): At least 20 percent but less than 40 percent impaired, limited or restricted Self Care Discharge Status [phone]): At least 20 percent but less than 40 percent impaired, limited or restricted   Marica Otter, OTR/L [phone] 04/07/2017  Devin Gilmore 04/07/2017, 9:30 AM

## 2017-04-07 NOTE — Evaluation (Signed)
Physical Therapy Evaluation-1x Patient Details Name: Devin Gilmore MRN: [id number] DOB: [birth year] Today's Date: 04/07/2017   History of Present Illness  51 yo male s/p L4-L5 decompression 04/06/17. Hx of previous back surgeries  Clinical Impression  On eval, pt was Supervision-Min guard assist for mobility. He walked ~250 feet with a RW. Pain rated 5/10 with activity. All education completed. Ready to d/c from PT standpoint.     Follow Up Recommendations No PT follow up;Supervision - Intermittent    Equipment Recommendations  None recommended by PT    Recommendations for Other Services       Precautions / Restrictions Precautions Precautions: Back Precaution Comments: Reviewed back precautions-pt able to recall 3/3 Restrictions Weight Bearing Restrictions: No      Mobility  Bed Mobility               General bed mobility comments: oob in recliner  Transfers Overall transfer level: Needs assistance Equipment used: Rolling walker (2 wheeled) Transfers: Sit to/from Stand Sit to Stand: Supervision         General transfer comment: cues for UE placement  Ambulation/Gait Ambulation/Gait assistance: Supervision Ambulation Distance (Feet): 250 Feet Assistive device: Rolling walker (2 wheeled) Gait Pattern/deviations: Step-through pattern     General Gait Details: good gait speed. Pt was not relying on walker very much. No LOB.   Stairs Stairs: Yes Stairs assistance: Min guard Stair Management: Forwards;One rail Right Number of Stairs: 4 General stair comments: close guard for safety.   Wheelchair Mobility    Modified Rankin (Stroke Patients Only)       Balance                                             Pertinent Vitals/Pain Pain Assessment: 0-10 Pain Score: 5  Pain Location: back Pain Descriptors / Indicators: Sore Pain Intervention(s): Monitored during session    Home Living Family/patient expects to be discharged  to:: Private residence Living Arrangements: Spouse/significant other Available Help at Discharge: Family;Available PRN/intermittently Type of Home: House Home Access: Stairs to enter Entrance Stairs-Rails: None (column on right side) Entrance Stairs-Number of Steps: 3 Home Layout: Two level Home Equipment: Emergency planning/management officer - 2 wheels;Bedside commode;Walker - 4 wheels Additional Comments: reacher, long sponge, long shoehorn, toilet aide    Prior Function Level of Independence: Independent               Hand Dominance        Extremity/Trunk Assessment   Upper Extremity Assessment Upper Extremity Assessment: Defer to OT evaluation    Lower Extremity Assessment Lower Extremity Assessment: Overall WFL for tasks assessed    Cervical / Trunk Assessment Cervical / Trunk Assessment: Normal  Communication   Communication: No difficulties  Cognition Arousal/Alertness: Awake/alert Behavior During Therapy: WFL for tasks assessed/performed Overall Cognitive Status: Within Functional Limits for tasks assessed                                        General Comments      Exercises     Assessment/Plan    PT Assessment Patent does not need any further PT services  PT Problem List         PT Treatment Interventions      PT Goals (Current goals can be  found in the Care Plan section)  Acute Rehab PT Goals Patient Stated Goal: return to independence PT Goal Formulation: All assessment and education complete, DC therapy    Frequency     Barriers to discharge        Co-evaluation               End of Session   Activity Tolerance: Patient tolerated treatment well Patient left: in chair;with call bell/phone within reach   PT Visit Diagnosis: Difficulty in walking, not elsewhere classified (R26.2)    Time: 0981-1914 PT Time Calculation (min) (ACUTE ONLY): 9 min   Charges:   PT Evaluation $PT Eval Low Complexity: 1 Procedure     PT G  Codes:   PT G-Codes **NOT FOR INPATIENT CLASS** Functional Assessment Tool Used: AM-PAC 6 Clicks Basic Mobility;Clinical judgement Functional Limitation: Mobility: Walking and moving around Mobility: Walking and Moving Around Current Status (N8295): At least 1 percent but less than 20 percent impaired, limited or restricted Mobility: Walking and Moving Around Goal Status [phone]): At least 1 percent but less than 20 percent impaired, limited or restricted Mobility: Walking and Moving Around Discharge Status [id number]): At least 1 percent but less than 20 percent impaired, limited or restricted      Rebeca Alert, MPT Pager: [phone]

## 2017-05-27 NOTE — Addendum Note (Signed)
Addendum  created 05/27/17 1256 by Hetty Linhart D, MD   Sign clinical note    

## 2018-12-02 ENCOUNTER — Ambulatory Visit: Payer: Self-pay | Admitting: Family Medicine

## 2018-12-02 ENCOUNTER — Encounter: Payer: Self-pay | Admitting: Family Medicine

## 2018-12-02 ENCOUNTER — Other Ambulatory Visit: Payer: Self-pay

## 2018-12-02 VITALS — BP 134/81 | HR 70 | Temp 98.0°F | Resp 16 | Ht 68.5 in | Wt 218.0 lb

## 2018-12-02 DIAGNOSIS — E1169 Type 2 diabetes mellitus with other specified complication: Secondary | ICD-10-CM

## 2018-12-02 DIAGNOSIS — I10 Essential (primary) hypertension: Secondary | ICD-10-CM

## 2018-12-02 LAB — POCT GLYCOSYLATED HEMOGLOBIN (HGB A1C): HEMOGLOBIN A1C: 10.7 % — AB (ref 4.0–5.6)

## 2018-12-02 MED ORDER — METFORMIN HCL 1000 MG PO TABS: 1000.0000 mg | ORAL_TABLET | Freq: Two times a day (BID) | ORAL | 2 refills | Status: DC

## 2018-12-02 MED ORDER — GLIPIZIDE 5 MG PO TABS: 5.0000 mg | ORAL_TABLET | Freq: Every day | ORAL | 2 refills | Status: DC

## 2018-12-02 MED ORDER — LISINOPRIL 10 MG PO TABS: 10.0000 mg | ORAL_TABLET | Freq: Every day | ORAL | 3 refills | Status: DC

## 2018-12-02 NOTE — Patient Instructions (Addendum)
Please call Primary Care at [place] , [phone] to schedule a new patient with Joaquin Courts, FNP Appointment in one month.   See new medication changes     If you have lab work done today you will be contacted with your lab results within the next 2 weeks.  If you have not heard from [place] then please contact us. The fastest way to get your results is to register for My Chart.   IF you received an x-ray today, you will receive an invoice from [facility]. Please contact [facility] at [phone] with questions or concerns regarding your invoice.   IF you received labwork today, you will receive an invoice from [facility]. Please contact [facility] at [phone] with questions or concerns regarding your invoice.   Our billing staff will not be able to assist you with questions regarding bills from these companies.  You will be contacted with the lab results as soon as they are available. The fastest way to get your results is to activate your My Chart account. Instructions are located on the last page of this paperwork. If you have not heard from [place] regarding the results in 2 weeks, please contact this office.         Diabetes Mellitus and Nutrition When you have diabetes (diabetes mellitus), it is very important to have healthy eating habits because your blood sugar (glucose) levels are greatly affected by what you eat and drink. Eating healthy foods in the appropriate amounts, at about the same times every day, can help you:  Control your blood glucose.  Lower your risk of heart disease.  Improve your blood pressure.  Reach or maintain a healthy weight.  Every person with diabetes is different, and each person has different needs for a meal plan. Your health care provider may recommend that you work with a diet and nutrition specialist (dietitian) to make a meal plan that is best for you. Your meal plan may vary depending on factors such as:  The calories  you need.  The medicines you take.  Your weight.  Your blood glucose, blood pressure, and cholesterol levels.  Your activity level.  Other health conditions you have, such as heart or kidney disease.  How do carbohydrates affect me? Carbohydrates affect your blood glucose level more than any other type of food. Eating carbohydrates naturally increases the amount of glucose in your blood. Carbohydrate counting is a method for keeping track of how many carbohydrates you eat. Counting carbohydrates is important to keep your blood glucose at a healthy level, especially if you use insulin or take certain oral diabetes medicines. It is important to know how many carbohydrates you can safely have in each meal. This is different for every person. Your dietitian can help you calculate how many carbohydrates you should have at each meal and for snack. Foods that contain carbohydrates include:  Bread, cereal, rice, pasta, and crackers.  Potatoes and corn.  Peas, beans, and lentils.  Milk and yogurt.  Fruit and juice.  Desserts, such as cakes, cookies, ice cream, and candy.  How does alcohol affect me? Alcohol can cause a sudden decrease in blood glucose (hypoglycemia), especially if you use insulin or take certain oral diabetes medicines. Hypoglycemia can be a life-threatening condition. Symptoms of hypoglycemia (sleepiness, dizziness, and confusion) are similar to symptoms of having too much alcohol. If your health care provider says that alcohol is safe for you, follow these guidelines:  Limit alcohol intake to no more than 1  drink per day for nonpregnant women and 2 drinks per day for men. One drink equals 12 oz of beer, 5 oz of wine, or 1 oz of hard liquor.  Do not drink on an empty stomach.  Keep yourself hydrated with water, diet soda, or unsweetened iced tea.  Keep in mind that regular soda, juice, and other mixers may contain a lot of sugar and must be counted as  carbohydrates.  What are tips for following this plan? Reading food labels  Start by checking the serving size on the label. The amount of calories, carbohydrates, fats, and other nutrients listed on the label are based on one serving of the food. Many foods contain more than one serving per package.  Check the total grams (g) of carbohydrates in one serving. You can calculate the number of servings of carbohydrates in one serving by dividing the total carbohydrates by 15. For example, if a food has 30 g of total carbohydrates, it would be equal to 2 servings of carbohydrates.  Check the number of grams (g) of saturated and trans fats in one serving. Choose foods that have low or no amount of these fats.  Check the number of milligrams (mg) of sodium in one serving. Most people should limit total sodium intake to less than 2,300 mg per day.  Always check the nutrition information of foods labeled as "low-fat" or "nonfat". These foods may be higher in added sugar or refined carbohydrates and should be avoided.  Talk to your dietitian to identify your daily goals for nutrients listed on the label. Shopping  Avoid buying canned, premade, or processed foods. These foods tend to be high in fat, sodium, and added sugar.  Shop around the outside edge of the grocery store. This includes fresh fruits and vegetables, bulk grains, fresh meats, and fresh dairy. Cooking  Use low-heat cooking methods, such as baking, instead of high-heat cooking methods like deep frying.  Cook using healthy oils, such as olive, canola, or sunflower oil.  Avoid cooking with butter, cream, or high-fat meats. Meal planning  Eat meals and snacks regularly, preferably at the same times every day. Avoid going long periods of time without eating.  Eat foods high in fiber, such as fresh fruits, vegetables, beans, and whole grains. Talk to your dietitian about how many servings of carbohydrates you can eat at each  meal.  Eat 4-6 ounces of lean protein each day, such as lean meat, chicken, fish, eggs, or tofu. 1 ounce is equal to 1 ounce of meat, chicken, or fish, 1 egg, or 1/4 cup of tofu.  Eat some foods each day that contain healthy fats, such as avocado, nuts, seeds, and fish. Lifestyle   Check your blood glucose regularly.  Exercise at least 30 minutes 5 or more days each week, or as told by your health care provider.  Take medicines as told by your health care provider.  Do not use any products that contain nicotine or tobacco, such as cigarettes and e-cigarettes. If you need help quitting, ask your health care provider.  Work with a Veterinary surgeon or diabetes educator to identify strategies to manage stress and any emotional and social challenges. What are some questions to ask my health care provider?  Do I need to meet with a diabetes educator?  Do I need to meet with a dietitian?  What number can I call if I have questions?  When are the best times to check my blood glucose? Where to find more  information:  American Diabetes Association: diabetes.org/food-and-fitness/food  Academy of Nutrition and Dietetics: https://www.vargas.com/www.eatright.org/resources/health/diseases-and-conditions/diabetes  [employer] Institute of Diabetes and Digestive and Kidney Diseases (NIH): FindJewelers.czwww.niddk.nih.gov/health-information/diabetes/overview/diet-eating-physical-activity Summary  A healthy meal plan will help you control your blood glucose and maintain a healthy lifestyle.  Working with a diet and nutrition specialist (dietitian) can help you make a meal plan that is best for you.  Keep in mind that carbohydrates and alcohol have immediate effects on your blood glucose levels. It is important to count carbohydrates and to use alcohol carefully. This information is not intended to replace advice given to you by your health care provider. Make sure you discuss any questions you have with your health care provider. Document  Released: 09/09/2005 Document Revised: 01/17/2017 Document Reviewed: 01/17/2017 Elsevier Interactive Patient Education  [employer]2018 Elsevier Inc.

## 2018-12-02 NOTE — Progress Notes (Signed)
Patient ID: Devin Gilmore, male    DOB: 1966/03/11, 52 y.o.   MRN: [id number]  PCP: Renford Dills, MD  Chief Complaint  Patient presents with  . Elavated Glucose    pt need to establish care with a PCP but need refills until he can be seen.     Subjective:  HPI  Devin Gilmore is a 52 y.o. male presents for evaluation of diabetes. He has a history of diabetes however had not been taking his medications consistently as he is without insurance.  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  He underwent a DOT physical and was noted to have elevated glucose.  He needs an A1C level today and needs to resume diabetes medication.  He endorses polyuria, polyphagia, polydipsia.  No recent eye exam.  Denies any visual disturbances, chest pain or shortness of breath.   Social History   Socioeconomic History  . Marital status: Married    Spouse name: Not on file  . Number of children: Not on file  . Years of education: Not on file  . Highest education level: Not on file  Occupational History  . Not on file  Social Needs  . Financial resource strain: Not on file  . Food insecurity:    Worry: Not on file    Inability: Not on file  . Transportation needs:    Medical: Not on file    Non-medical: Not on file  Tobacco Use  . Smoking status: Never Smoker  . Smokeless tobacco: Never Used  Substance and Sexual Activity  . Alcohol use: Yes    Alcohol/week: 1.0 standard drinks    Types: 1 Cans of beer per week    Comment: occas  . Drug use: No  . Sexual activity: Not on file  Lifestyle  . Physical activity:    Days per week: Not on file    Minutes per session: Not on file  . Stress: Not on file  Relationships  . Social connections:    Talks on phone: Not on file    Gets together: Not on file    Attends religious service: Not on file    Active member of club or organization: Not on file    Attends  meetings of clubs or organizations: Not on file    Relationship status: Not on file  . Intimate partner violence:    Fear of current or ex partner: Not on file    Emotionally abused: Not on file    Physically abused: Not on file    Forced sexual activity: Not on file  Other Topics Concern  . Not on file  Social History Narrative  . Not on file    History reviewed. No pertinent family history.   Review of Systems  Patient Active Problem List   Diagnosis Date Noted  . HNP (herniated nucleus pulposus), lumbar 04/06/2017  . Spinal stenosis at L4-L5 level 04/06/2017  . Radiculopathy 12/25/2014    Allergies  Allergen Reactions  . Amoxicillin Anaphylaxis    Has patient had a PCN reaction causing immediate rash, facial/tongue/throat swelling, SOB or lightheadedness with hypotension: Yes Has patient had a PCN reaction causing severe rash involving mucus membranes or skin necrosis: No Has patient had a PCN reaction that required hospitalization No Has patient had a PCN reaction occurring within the last 10 years: Yes If all of the above answers are "NO", then  may proceed with Cephalosporin use.     Prior to Admission medications   Medication Sig Start Date End Date Taking? Authorizing Provider  acetaminophen (TYLENOL) 500 MG tablet Take 1,000 mg by mouth every 8 (eight) hours as needed for mild pain.   Yes [provider]  glimepiride (AMARYL) 4 MG tablet Take 4 mg by mouth daily with breakfast.   Yes [provider]  lisinopril (PRINIVIL,ZESTRIL) 10 MG tablet Take 10 mg by mouth daily. 03/02/17  Yes [provider]  metFORMIN (GLUCOPHAGE-XR) 500 MG 24 hr tablet Take 1 tablet (500 mg total) by mouth daily with breakfast. Patient taking differently: Take 1,000 mg by mouth every evening.  11/24/16  Yes Melene Plan, DO    Past Medical, Surgical Family and Social History reviewed and updated.    Objective:   Today's Vitals   12/02/18 1122  BP: 134/81   Pulse: 70  Resp: 16  Temp: 98 F (36.7 C)  TempSrc: Oral  SpO2: 98%  Weight: 218 lb (98.9 kg)  Height: 5' 8.5" (1.74 m)    Wt Readings from Last 3 Encounters:  12/02/18 218 lb (98.9 kg)  04/06/17 224 lb (101.6 kg)  03/31/17 224 lb (101.6 kg)     Physical Exam General appearance: alert, well developed, well nourished, cooperative and in no distress Head: Normocephalic, without obvious abnormality, atraumatic Respiratory: Respirations even and unlabored, normal respiratory rate Heart: rate and rhythm normal. No gallop or murmurs noted on exam  Extremities: No gross deformities Skin: Skin color, texture, turgor normal. No rashes seen  Psych: Appropriate mood and affect. Neurologic: Mental status: Alert, oriented to person, place, and time, thought content appropriate.  No results found for: POCGLU  Lab Results  Component Value Date   HGBA1C 10.7 (A) 12/02/2018      Assessment & Plan:  1. Type 2 diabetes mellitus with other specified complication, unspecified whether long term insulin use (HCC) - POCT glycosylated hemoglobin (Hb A1C)-10.7 uncontrolled Resuming metformin 1000 mg twice daily.  Adding glipizide 5 mg once daily with breakfast.  Advised patient he will require long-term management of diabetes as it is grossly elevated.  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  He is uninsured therefore there is limited options available given financial limitations.  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  Advised to [facility] for [facility]-up of diabetes and to have access to [facility].  Patient verbalized understanding and agreement with plan.  2. Essential Hypertension, controlled.  Resumed lisinopril We have discussed target BP range and blood pressure goal. I have advised patient to check BP regularly  and to call us back or report to clinic if the numbers are consistently higher than 140/90. We discussed the importance of compliance with medical therapy and DASH diet recommended, consequences of uncontrolled hypertension discussed.  - continue current BP medications    Meds ordered this encounter  Medications  . lisinopril (PRINIVIL,ZESTRIL) 10 MG tablet    Sig: Take 1 tablet (10 mg total) by mouth daily.    Dispense:  30 tablet    Refill:  3  . metFORMIN (GLUCOPHAGE) 1000 MG tablet    Sig: Take 1 tablet (1,000 mg total) by mouth 2 (two) times daily with a meal.    Dispense:  60 tablet  Refill:  2  . glipiZIDE (GLUCOTROL) 5 MG tablet    Sig: Take 1 tablet (5 mg total) by mouth daily before breakfast.    Dispense:  30 tablet    Refill:  2    Orders Placed This Encounter  Procedures  . POCT glycosylated hemoglobin (Hb A1C)       -The patient was given clear instructions to go to ER or return to medical center if symptoms do not improve, worsen or new problems develop. The patient verbalized understanding.     Godfrey PickKimberly S. Tiburcio PeaHarris, FNP-C Nurse Practitioner (PRN Staff)  [facility] [address] [place], [place]  [phone]

## 2019-03-06 ENCOUNTER — Other Ambulatory Visit: Payer: Self-pay

## 2019-03-06 ENCOUNTER — Ambulatory Visit: Payer: BLUE CROSS/BLUE SHIELD | Admitting: Family Medicine

## 2019-03-06 ENCOUNTER — Encounter: Payer: Self-pay | Admitting: Family Medicine

## 2019-03-06 VITALS — BP 130/78 | HR 72 | Temp 98.6°F | Ht 68.5 in | Wt 200.8 lb

## 2019-03-06 DIAGNOSIS — E1169 Type 2 diabetes mellitus with other specified complication: Secondary | ICD-10-CM

## 2019-03-06 DIAGNOSIS — Z1211 Encounter for screening for malignant neoplasm of colon: Secondary | ICD-10-CM | POA: Diagnosis not present

## 2019-03-06 DIAGNOSIS — Z125 Encounter for screening for malignant neoplasm of prostate: Secondary | ICD-10-CM | POA: Diagnosis not present

## 2019-03-06 DIAGNOSIS — I10 Essential (primary) hypertension: Secondary | ICD-10-CM

## 2019-03-06 DIAGNOSIS — Z23 Encounter for immunization: Secondary | ICD-10-CM

## 2019-03-06 LAB — POCT GLYCOSYLATED HEMOGLOBIN (HGB A1C): Hemoglobin A1C: 6.6 % — AB (ref 4.0–5.6)

## 2019-03-06 MED ORDER — METFORMIN HCL 1000 MG PO TABS: 1000.0000 mg | ORAL_TABLET | Freq: Two times a day (BID) | ORAL | 1 refills | Status: DC

## 2019-03-06 MED ORDER — LISINOPRIL 10 MG PO TABS: 10.0000 mg | ORAL_TABLET | Freq: Every day | ORAL | 1 refills | Status: DC

## 2019-03-06 MED ORDER — GLIPIZIDE 5 MG PO TABS
5.0000 mg | ORAL_TABLET | Freq: Every day | ORAL | 1 refills | Status: DC
Start: 2019-03-06 — End: 2019-08-17

## 2019-03-06 NOTE — Progress Notes (Signed)
 3/10/20203:23 PM  Devin Gilmore 10/30/1966, 53 y.o. male [id number]  Chief Complaint  Patient presents with  . Diabetes  . Medication Refill    on all verified medication    HPI:   Patient is a 53 y.o. male with past medical history significant for DM2 and HTN who presents today for routine followup  Last OV dec 2019 with Harris, FNP Restarted metformin, added glipizide Restarted lisinopril  Eating better options when on the road Trying to eat more salads and veggies Not really excercising Does not check cbgs Denies any sx of hypoglycemia Wears eye glasses Has taken lisinopril this morning  Lab Results  Component Value Date   HGBA1C 10.7 (A) 12/02/2018   Lab Results  Component Value Date   CREATININE 1.01 03/31/2017    Fall Risk  03/06/2019 12/02/2018  Falls in the past year? 0 0  Number falls in past yr: 0 0  Injury with Fall? 0 0     Depression screen PHQ 2/9 03/06/2019 12/02/2018  Decreased Interest 0 0  Down, Depressed, Hopeless 0 0  PHQ - 2 Score 0 0    Allergies  Allergen Reactions  . Amoxicillin Anaphylaxis    Has patient had a PCN reaction causing immediate rash, facial/tongue/throat swelling, SOB or lightheadedness with hypotension: Yes Has patient had a PCN reaction causing severe rash involving mucus membranes or skin necrosis: No Has patient had a PCN reaction that required hospitalization No Has patient had a PCN reaction occurring within the last 10 years: Yes If all of the above answers are "NO", then may proceed with Cephalosporin use.     Prior to Admission medications   Medication Sig Start Date End Date Taking? Authorizing Provider  acetaminophen (TYLENOL) 500 MG tablet Take 1,000 mg by mouth every 8 (eight) hours as needed for mild pain.   Yes [provider]  glipiZIDE (GLUCOTROL) 5 MG tablet Take 1 tablet (5 mg total) by mouth daily before breakfast. 12/02/18  Yes Harris, Kimberly S, FNP  lisinopril (PRINIVIL,ZESTRIL)  10 MG tablet Take 1 tablet (10 mg total) by mouth daily. 12/02/18  Yes Harris, Kimberly S, FNP  metFORMIN (GLUCOPHAGE) 1000 MG tablet Take 1 tablet (1,000 mg total) by mouth 2 (two) times daily with a meal. 12/02/18  Yes Harris, Kimberly S, FNP    Past Medical History:  Diagnosis Date  . Diabetes mellitus without complication (HCC)   . Heart murmur    echo 05 no problems    Past Surgical History:  Procedure Laterality Date  . BACK SURGERY  2010  . LUMBAR LAMINECTOMY/DECOMPRESSION MICRODISCECTOMY Bilateral 04/06/2017   Procedure: Microlumbar decompression L4-5 Bilateral ;  Surgeon: Jeffrey Beane, MD;  Location: [facility];  Service: Orthopedics;  Laterality: Bilateral;  Requests for 2 hrs  . SPINAL CORD DECOMPRESSION  12/25/2014   L3  L4   Dr Dumonski    Social History   Tobacco Use  . Smoking status: Never Smoker  . Smokeless tobacco: Never Used  Substance Use Topics  . Alcohol use: Yes    Alcohol/week: 1.0 standard drinks    Types: 1 Cans of beer per week    Comment: occas    Family History  Problem Relation Age of Onset  . Hypertension Mother   . Healthy Sister   . Healthy Brother   . Healthy Daughter   . Healthy Son     Review of Systems  Constitutional: Negative for chills and fever.  Respiratory: Negative for cough and shortness   of breath.   Cardiovascular: Negative for chest pain, palpitations and leg swelling.  Gastrointestinal: Negative for abdominal pain, nausea and vomiting.    OBJECTIVE:  Blood pressure 130/78, pulse 72, temperature 98.6 F (37 C), temperature source Oral, height 5' 8.5" (1.74 m), weight 200 lb 12.8 oz (91.1 kg), SpO2 100 %.. Body mass index is 30.09 kg/m.   BP Readings from Last 3 Encounters:  03/06/19 130/78  12/02/18 134/81  04/07/17 139/80    Wt Readings from Last 3 Encounters:  03/06/19 200 lb 12.8 oz (91.1 kg)  12/02/18 218 lb (98.9 kg)  04/06/17 224 lb (101.6 kg)    Physical Exam Vitals signs and nursing note reviewed.    Constitutional:      Appearance: He is well-developed.  HENT:     Head: Normocephalic and atraumatic.  Eyes:     Conjunctiva/sclera: Conjunctivae normal.     Pupils: Pupils are equal, round, and reactive to light.  Neck:     Musculoskeletal: Neck supple.  Cardiovascular:     Rate and Rhythm: Normal rate and regular rhythm.     Heart sounds: No murmur. No friction rub. No gallop.   Pulmonary:     Effort: Pulmonary effort is normal.     Breath sounds: Normal breath sounds. No wheezing or rales.  Skin:    General: Skin is warm and dry.  Neurological:     Mental Status: He is alert and oriented to person, place, and time.     Results for orders placed or performed in visit on 03/06/19 (from the past 24 hour(s))  POCT glycosylated hemoglobin (Hb A1C)     Status: Abnormal   Collection Time: 03/06/19  3:37 PM  Result Value Ref Range   Hemoglobin A1C 6.6 (A) 4.0 - 5.6 %   HbA1c POC (<> result, manual entry)     HbA1c, POC (prediabetic range)     HbA1c, POC (controlled diabetic range)      ASSESSMENT and PLAN  1. Type 2 diabetes mellitus with other specified complication, unspecified whether long term insulin use (HCC) Controlled. Continue current regime.  - POCT glycosylated hemoglobin (Hb A1C) - Lipid panel - TSH - CMP14+EGFR - Microalbumin / creatinine urine ratio  2. Essential hypertension Controlled. Continue current regime.  - Care order/instruction:  3. Screening for colon cancer - Cologuard  4. Screening for prostate cancer - PSA  5. Need for vaccination - Td vaccine greater than or equal to 7yo preservative free IM  Other orders - glipiZIDE (GLUCOTROL) 5 MG tablet; Take 1 tablet (5 mg total) by mouth daily before breakfast. - lisinopril (PRINIVIL,ZESTRIL) 10 MG tablet; Take 1 tablet (10 mg total) by mouth daily. - metFORMIN (GLUCOPHAGE) 1000 MG tablet; Take 1 tablet (1,000 mg total) by mouth 2 (two) times daily with a meal.   Return in about 3 months  (around 06/06/2019).    Rutherford Guys, MD Primary Care at [facility], [place] [address] Ph.  [phone] Fax [phone]

## 2019-03-07 LAB — CMP14+EGFR
ALT: 25 IU/L (ref 0–44)
AST: 18 IU/L (ref 0–40)
Albumin/Globulin Ratio: 1.5 (ref 1.2–2.2)
Albumin: 4.3 g/dL (ref 3.8–4.9)
Alkaline Phosphatase: 55 IU/L (ref 39–117)
BUN/Creatinine Ratio: 12 (ref 9–20)
BUN: 13 mg/dL (ref 6–24)
Bilirubin Total: 0.3 mg/dL (ref 0.0–1.2)
CO2: 21 mmol/L (ref 20–29)
Calcium: 9.9 mg/dL (ref 8.7–10.2)
Chloride: 104 mmol/L (ref 96–106)
Creatinine, Ser: 1.12 mg/dL (ref 0.76–1.27)
GFR calc Af Amer: 86 mL/min/{1.73_m2} (ref >59)
GFR calc non Af Amer: 75 mL/min/{1.73_m2} (ref >59)
Globulin, Total: 2.9 g/dL (ref 1.5–4.5)
Glucose: 100 mg/dL — ABNORMAL HIGH (ref 65–99)
Potassium: 4.5 mmol/L (ref 3.5–5.2)
Sodium: 142 mmol/L (ref 134–144)
Total Protein: 7.2 g/dL (ref 6.0–8.5)

## 2019-03-07 LAB — LIPID PANEL
Chol/HDL Ratio: 3.7 ratio (ref 0.0–5.0)
Cholesterol, Total: 159 mg/dL (ref 100–199)
HDL: 43 mg/dL (ref >39)
LDL Calculated: 97 mg/dL (ref 0–99)
Triglycerides: 97 mg/dL (ref 0–149)
VLDL Cholesterol Cal: 19 mg/dL (ref 5–40)

## 2019-03-07 LAB — PSA: Prostate Specific Ag, Serum: 0.8 ng/mL (ref 0.0–4.0)

## 2019-03-07 LAB — MICROALBUMIN / CREATININE URINE RATIO
Creatinine, Urine: 220.5 mg/dL
Microalb/Creat Ratio: 4 mg/g creat (ref 0–29)
Microalbumin, Urine: 8.5 ug/mL

## 2019-03-07 LAB — TSH: TSH: 1.12 u[IU]/mL (ref 0.450–4.500)

## 2019-08-17 ENCOUNTER — Other Ambulatory Visit: Payer: Self-pay | Admitting: Family Medicine

## 2019-08-17 NOTE — Telephone Encounter (Signed)
Medication Refill - Medication: metFORMIN (GLUCOPHAGE) 1000 MG tablet and lisinopril (ZESTRIL) 10 MG tablet and glipiZIDE (GLUCOTROL) 5 MG tablet    Preferred Pharmacy (with phone number or street name):  [id number] [address], [place] [phone] (Phone) [phone] (Fax)

## 2020-02-02 ENCOUNTER — Other Ambulatory Visit: Payer: Self-pay | Admitting: Family Medicine

## 2020-02-02 NOTE — Telephone Encounter (Signed)
Requested medication (s) are due for refill today: yes  Requested medication (s) are on the active medication list: yes  Last refill:  08/17/19 for both requested medication refills  Future visit scheduled: no  Notes to clinic:  > 3 mos overdue for appt   Requested Prescriptions  Pending Prescriptions Disp Refills   glipiZIDE (GLUCOTROL) 5 MG tablet [Pharmacy Med Name: GLIPIZIDE 5 MG TABLET] 30 tablet 5    Sig: TAKE 1 TABLET (5 MG TOTAL) BY MOUTH DAILY BEFORE BREAKFAST.      Endocrinology:  Diabetes - Sulfonylureas Failed - 02/02/2020  2:31 PM      Failed - HBA1C is between 0 and 7.9 and within 180 days    Hemoglobin A1C  Date Value Ref Range Status  03/06/2019 6.6 (A) 4.0 - 5.6 % Final          Failed - Valid encounter within last 6 months    Recent Outpatient Visits           11 months ago Type 2 diabetes mellitus with other specified complication, unspecified whether long term insulin use (HCC)   [facility], Meda Coffee, MD   1 year ago Type 2 diabetes mellitus with other specified complication, unspecified whether long term insulin use [facility])   Primary Care at [facility], Godfrey Pick, FNP                lisinopril (ZESTRIL) 10 MG tablet [Pharmacy Med Name: LISINOPRIL 10 MG TABLET] 30 tablet 5    Sig: TAKE 1 TABLET BY MOUTH EVERY DAY      Cardiovascular:  ACE Inhibitors Failed - 02/02/2020  2:31 PM      Failed - Cr in normal range and within 180 days    Creatinine, Ser  Date Value Ref Range Status  03/06/2019 1.12 0.76 - 1.27 mg/dL Final          Failed - K in normal range and within 180 days    Potassium  Date Value Ref Range Status  03/06/2019 4.5 3.5 - 5.2 mmol/L Final          Failed - Valid encounter within last 6 months    Recent Outpatient Visits           11 months ago Type 2 diabetes mellitus with other specified complication, unspecified whether long term insulin use (HCC)   [facility], Meda Coffee, MD   1 year  ago Type 2 diabetes mellitus with other specified complication, unspecified whether long term insulin use [facility])   Primary Care at [place], Godfrey Pick, FNP              Passed - Patient is not pregnant      Passed - Last BP in normal range    BP Readings from Last 1 Encounters:  03/06/19 130/78

## 2020-02-04 NOTE — Telephone Encounter (Signed)
No further refills without office visit 

## 2020-03-02 ENCOUNTER — Other Ambulatory Visit: Payer: Self-pay | Admitting: Family Medicine

## 2020-03-02 DIAGNOSIS — E1169 Type 2 diabetes mellitus with other specified complication: Secondary | ICD-10-CM

## 2020-03-02 NOTE — Telephone Encounter (Signed)
Requested medication (s) are due for refill today: yes  Requested medication (s) are on the active medication list: yes  Last refill:  02/04/20  Future visit scheduled: no  Notes to clinic:  >3 months overdue for appt. [facility] does not make appts for this practice   Requested Prescriptions  Pending Prescriptions Disp Refills   glipiZIDE (GLUCOTROL) 5 MG tablet [Pharmacy Med Name: GLIPIZIDE 5 MG TABLET] 30 tablet 0    Sig: TAKE 1 TABLET (5 MG TOTAL) BY MOUTH DAILY BEFORE BREAKFAST.      Endocrinology:  Diabetes - Sulfonylureas Failed - 03/02/2020 12:32 PM      Failed - HBA1C is between 0 and 7.9 and within 180 days    Hemoglobin A1C  Date Value Ref Range Status  03/06/2019 6.6 (A) 4.0 - 5.6 % Final          Failed - Valid encounter within last 6 months    Recent Outpatient Visits           12 months ago Type 2 diabetes mellitus with other specified complication, unspecified whether long term insulin use [facility])   [facility], Meda Coffee, MD   1 year ago Type 2 diabetes mellitus with other specified complication, unspecified whether long term insulin use [facility])   Primary Care at [facility], Godfrey Pick, FNP

## 2020-03-02 NOTE — Telephone Encounter (Signed)
Requested medication (s) are due for refill today: yes  Requested medication (s) are on the active medication list: yes  Last refill:  02/04/20  Future visit scheduled: no  Notes to clinic:  pt overdue for appt/ [facility] does not make appts for this practice   Requested Prescriptions  Pending Prescriptions Disp Refills   lisinopril (ZESTRIL) 10 MG tablet [Pharmacy Med Name: LISINOPRIL 10 MG TABLET] 30 tablet 0    Sig: TAKE 1 TABLET BY MOUTH EVERY DAY      Cardiovascular:  ACE Inhibitors Failed - 03/02/2020 12:33 PM      Failed - Cr in normal range and within 180 days    Creatinine, Ser  Date Value Ref Range Status  03/06/2019 1.12 0.76 - 1.27 mg/dL Final          Failed - K in normal range and within 180 days    Potassium  Date Value Ref Range Status  03/06/2019 4.5 3.5 - 5.2 mmol/L Final          Failed - Valid encounter within last 6 months    Recent Outpatient Visits           12 months ago Type 2 diabetes mellitus with other specified complication, unspecified whether long term insulin use (HCC)   [facility], Meda Coffee, MD   1 year ago Type 2 diabetes mellitus with other specified complication, unspecified whether long term insulin use [facility])   Primary Care at [place], Godfrey Pick, FNP              Passed - Patient is not pregnant      Passed - Last BP in normal range    BP Readings from Last 1 Encounters:  03/06/19 130/78

## 2020-03-04 NOTE — Telephone Encounter (Signed)
Called pt. And LVM to call back for refill

## 2020-05-05 ENCOUNTER — Other Ambulatory Visit: Payer: Self-pay

## 2020-05-05 ENCOUNTER — Telehealth: Payer: Self-pay | Admitting: Family Medicine

## 2020-05-05 MED ORDER — METFORMIN HCL 1000 MG PO TABS: 1000.0000 mg | ORAL_TABLET | Freq: Two times a day (BID) | ORAL | 5 refills | Status: DC

## 2020-05-05 MED ORDER — GLIPIZIDE 5 MG PO TABS: 5.0000 mg | ORAL_TABLET | Freq: Every day | ORAL | 2 refills | Status: DC

## 2020-05-05 MED ORDER — LISINOPRIL 10 MG PO TABS: 10.0000 mg | ORAL_TABLET | Freq: Every day | ORAL | 2 refills | Status: DC

## 2020-05-05 NOTE — Telephone Encounter (Signed)
Pt is wanting a courtesy refill for his medications. Pt has a appt for 07/14/20 that was the earliest he could get on providers schedule. Pt did not relay the medication he was needing. Pt would like a call when these medications are sent in. [phone] Please advise.

## 2020-07-14 ENCOUNTER — Telehealth (INDEPENDENT_AMBULATORY_CARE_PROVIDER_SITE_OTHER): Payer: BC Managed Care – PPO | Admitting: Family Medicine

## 2020-07-14 ENCOUNTER — Encounter: Payer: Self-pay | Admitting: Family Medicine

## 2020-07-14 DIAGNOSIS — E1169 Type 2 diabetes mellitus with other specified complication: Secondary | ICD-10-CM

## 2020-07-14 DIAGNOSIS — I10 Essential (primary) hypertension: Secondary | ICD-10-CM | POA: Diagnosis not present

## 2020-07-14 MED ORDER — GLIPIZIDE 5 MG PO TABS: 5.0000 mg | ORAL_TABLET | Freq: Every day | ORAL | 0 refills | Status: DC

## 2020-07-14 MED ORDER — METFORMIN HCL 1000 MG PO TABS: 1000.0000 mg | ORAL_TABLET | Freq: Two times a day (BID) | ORAL | 0 refills | Status: DC

## 2020-07-14 MED ORDER — LISINOPRIL 10 MG PO TABS: 10.0000 mg | ORAL_TABLET | Freq: Every day | ORAL | 0 refills | Status: DC

## 2020-07-14 NOTE — Progress Notes (Signed)
Virtual Visit Note  I connected with patient on 07/14/20 at 322pm by video doximity and verified that I am speaking with the correct person using two identifiers. Devin Gilmore is currently located at home and patient is currently with them during visit. The provider, Myles Lipps, MD is located in their office at time of visit.  I discussed the limitations, risks, security and privacy concerns of performing an evaluation and management service by telephone and the availability of in person appointments. I also discussed with the patient that there may be a patient responsible charge related to this service. The patient expressed understanding and agreed to proceed.   I provided 11 minutes of non-face-to-face time during this encounter.  Chief Complaint  Patient presents with  . Diabetes    med refill / glipizide  . Hypertension    med refill/ lisinopril   . 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 Amoxicillin Anaphylaxis    Has patient had a PCN reaction causing immediate rash, facial/tongue/throat swelling, SOB or lightheadedness with hypotension: Yes Has patient had a PCN  reaction causing severe rash involving mucus membranes or skin necrosis: No Has patient had a PCN reaction that required hospitalization No Has patient had a PCN reaction occurring within the last 10 years: Yes If all of the above answers are "NO", then may proceed with Cephalosporin use.     Prior to Admission medications   Medication Sig Start Date End Date Taking? Authorizing Provider  glipiZIDE (GLUCOTROL) 5 MG tablet Take 1 tablet (5 mg total) by mouth daily before breakfast. 05/05/20  Yes Myles Lipps, MD  lisinopril (ZESTRIL) 10 MG tablet Take 1 tablet (10 mg total) by mouth daily. 05/05/20  Yes Myles Lipps, MD  metFORMIN (GLUCOPHAGE) 1000 MG tablet Take 1 tablet (1,000 mg total) by mouth 2 (two) times daily with a meal. 05/05/20  Yes Myles Lipps, MD  acetaminophen (TYLENOL) 500 MG tablet Take 1,000 mg by mouth every 8 (eight) hours as needed for mild pain. Patient not taking: Reported on 07/14/2020    [provider]    Past Medical History:  Diagnosis Date  . Diabetes mellitus without complication (HCC)   . Heart murmur    echo 05 no problems    Past Surgical History:  Procedure Laterality Date  . BACK SURGERY  2010  . LUMBAR LAMINECTOMY/DECOMPRESSION MICRODISCECTOMY Bilateral 04/06/2017   Procedure: Microlumbar decompression L4-5 Bilateral ;  Surgeon: Jene Every, MD;  Location: [facility];  Service: Orthopedics;  Laterality: Bilateral;  Requests for 2 hrs  . SPINAL  CORD DECOMPRESSION  12/25/2014   L3  L4   Dr Yevette Edwards    Social History   Tobacco Use  . Smoking status: Never Smoker  . Smokeless tobacco: Never Used  Substance Use Topics  . Alcohol use: Yes    Alcohol/week: 1.0 standard drink    Types: 1 Cans of beer per week    Comment: occas    Family History  Problem Relation Age of Onset  . Hypertension Mother   . Healthy Sister   . Healthy Brother   . Healthy Daughter   . Healthy Son     Review of Systems  Constitutional: Negative for  chills and fever.  Respiratory: Negative for cough and shortness of breath.   Cardiovascular: Negative for chest pain, palpitations and leg swelling.  Gastrointestinal: Negative for abdominal pain, nausea and vomiting.    Objective  Vitals as reported by the patient: per above  GEN: AAOx3, NAD HEENT: [place]/AT, pupils are symmetrical, EOMI, non-icteric sclera Resp: breathing comfortably, speaking in full sentences Skin: no rashes noted, no pallor Psych: good eye contact, normal mood and affect   ASSESSMENT and PLAN  1. Type 2 diabetes mellitus with other specified complication, unspecified whether long term insulin use (HCC) Checking labs today, medications will be adjusted as needed. Discussed LFM - Hemoglobin A1c; Future - Lipid panel; Future - Comprehensive metabolic panel; Future - Microalbumin / creatinine urine ratio; Future - Ambulatory referral to Ophthalmology  2. Essential hypertension Controlled. Continue current regime.   Other orders - metFORMIN (GLUCOPHAGE) 1000 MG tablet; Take 1 tablet (1,000 mg total) by mouth 2 (two) times daily with a meal. - glipiZIDE (GLUCOTROL) 5 MG tablet; Take 1 tablet (5 mg total) by mouth daily before breakfast. - lisinopril (ZESTRIL) 10 MG tablet; Take 1 tablet (10 mg total) by mouth daily.  FOLLOW-UP: fasting labs in 2-3 days, OV in 4 weeks   The above assessment and management plan was discussed with the patient. The patient verbalized understanding of and has agreed to the management plan. Patient is aware to call the clinic if symptoms persist or worsen. Patient is aware when to return to the clinic for a follow-up visit. Patient educated on when it is appropriate to go to the emergency department.     Myles Lipps, MD Primary Care at [facility] [address] [place], [place] [address] Ph.  [phone] Fax [phone]

## 2020-08-04 ENCOUNTER — Ambulatory Visit (INDEPENDENT_AMBULATORY_CARE_PROVIDER_SITE_OTHER): Payer: BC Managed Care – PPO | Admitting: Emergency Medicine

## 2020-08-04 ENCOUNTER — Other Ambulatory Visit: Payer: Self-pay

## 2020-08-04 DIAGNOSIS — E1169 Type 2 diabetes mellitus with other specified complication: Secondary | ICD-10-CM

## 2020-08-05 LAB — COMPREHENSIVE METABOLIC PANEL
ALT: 16 IU/L (ref 0–44)
AST: 13 IU/L (ref 0–40)
Albumin/Globulin Ratio: 1.6 (ref 1.2–2.2)
Albumin: 4.5 g/dL (ref 3.8–4.9)
Alkaline Phosphatase: 68 IU/L (ref 48–121)
BUN/Creatinine Ratio: 10 (ref 9–20)
BUN: 10 mg/dL (ref 6–24)
Bilirubin Total: 0.7 mg/dL (ref 0.0–1.2)
CO2: 25 mmol/L (ref 20–29)
Calcium: 9.4 mg/dL (ref 8.7–10.2)
Chloride: 101 mmol/L (ref 96–106)
Creatinine, Ser: 1.01 mg/dL (ref 0.76–1.27)
GFR calc Af Amer: 97 mL/min/{1.73_m2} (ref >59)
GFR calc non Af Amer: 84 mL/min/{1.73_m2} (ref >59)
Globulin, Total: 2.8 g/dL (ref 1.5–4.5)
Glucose: 142 mg/dL — ABNORMAL HIGH (ref 65–99)
Potassium: 4.3 mmol/L (ref 3.5–5.2)
Sodium: 139 mmol/L (ref 134–144)
Total Protein: 7.3 g/dL (ref 6.0–8.5)

## 2020-08-05 LAB — LIPID PANEL
Chol/HDL Ratio: 3.2 ratio (ref 0.0–5.0)
Cholesterol, Total: 174 mg/dL (ref 100–199)
HDL: 55 mg/dL (ref >39)
LDL Chol Calc (NIH): 105 mg/dL — ABNORMAL HIGH (ref 0–99)
Triglycerides: 76 mg/dL (ref 0–149)
VLDL Cholesterol Cal: 14 mg/dL (ref 5–40)

## 2020-08-05 LAB — MICROALBUMIN / CREATININE URINE RATIO
Creatinine, Urine: 142.8 mg/dL
Microalb/Creat Ratio: 2 mg/g creat (ref 0–29)
Microalbumin, Urine: 3.5 ug/mL

## 2020-08-05 LAB — HEMOGLOBIN A1C
Est. average glucose Bld gHb Est-mCnc: 157 mg/dL
Hgb A1c MFr Bld: 7.1 % — ABNORMAL HIGH (ref 4.8–5.6)

## 2020-08-11 ENCOUNTER — Ambulatory Visit: Payer: BC Managed Care – PPO | Admitting: Family Medicine

## 2020-08-11 ENCOUNTER — Other Ambulatory Visit: Payer: Self-pay

## 2020-08-11 ENCOUNTER — Encounter: Payer: Self-pay | Admitting: Family Medicine

## 2020-08-11 ENCOUNTER — Telehealth: Payer: Self-pay | Admitting: Family Medicine

## 2020-08-11 VITALS — BP 140/88 | HR 62 | Temp 97.8°F | Ht 68.5 in | Wt 203.0 lb

## 2020-08-11 DIAGNOSIS — I10 Essential (primary) hypertension: Secondary | ICD-10-CM | POA: Diagnosis not present

## 2020-08-11 DIAGNOSIS — Z1211 Encounter for screening for malignant neoplasm of colon: Secondary | ICD-10-CM

## 2020-08-11 DIAGNOSIS — Z23 Encounter for immunization: Secondary | ICD-10-CM

## 2020-08-11 DIAGNOSIS — E1169 Type 2 diabetes mellitus with other specified complication: Secondary | ICD-10-CM

## 2020-08-11 MED ORDER — METFORMIN HCL 1000 MG PO TABS: 1000.0000 mg | ORAL_TABLET | Freq: Two times a day (BID) | ORAL | 1 refills | Status: DC

## 2020-08-11 MED ORDER — GLIPIZIDE 5 MG PO TABS: 5.0000 mg | ORAL_TABLET | Freq: Every day | ORAL | 1 refills | Status: DC

## 2020-08-11 MED ORDER — LISINOPRIL 10 MG PO TABS: 10.0000 mg | ORAL_TABLET | Freq: Every day | ORAL | 1 refills | Status: DC

## 2020-08-11 NOTE — Patient Instructions (Signed)
pcp    If you have lab work done today you will be contacted with your lab results within the next 2 weeks.  If you have not heard from us then please contact us. The fastest way to get your results is to register for My Chart.   IF you received an x-ray today, you will receive an invoice from [facility]. Please contact [facility] at [phone] with questions or concerns regarding your invoice.   IF you received labwork today, you will receive an invoice from [facility]. Please contact [facility] at [phone] with questions or concerns regarding your invoice.   Our billing staff will not be able to assist you with questions regarding bills from these companies.  You will be contacted with the lab results as soon as they are available. The fastest way to get your results is to activate your My Chart account. Instructions are located on the last page of this paperwork. If you have not heard from us regarding the results in 2 weeks, please contact this office.     

## 2020-08-11 NOTE — Telephone Encounter (Signed)
Called pt LVM for him to call us back (pt needs to schedule 1 month follow up around 09/11/2020 with Dr.Santiago )

## 2020-08-11 NOTE — Progress Notes (Signed)
8/16/202111:09 AM  Devin Gilmore 1966/06/22, 54 y.o., male [id number]  Chief Complaint  Patient presents with  . Diabetes  . Hypertension    HPI:   Patient is a 54 y.o. male with past medical history significant for DM2 and HTN who presents today for routine followup  Had OV July 2021 - telemedicine, refilled meds Overall he is doing well He has no acute concerns today Taking lisinopril, metformin and glipizide Denies any hypoglycemia Completed covid vaccines  Lab Results  Component Value Date   HGBA1C 7.1 (H) 08/04/2020   HGBA1C 6.6 (A) 03/06/2019   HGBA1C 10.7 (A) 12/02/2018   Lab Results  Component Value Date   LDLCALC 105 (H) 08/04/2020   CREATININE 1.01 08/04/2020   Lab Results  Component Value Date   LABMICR 3.5 08/04/2020   LABMICR 8.5 03/06/2019      Depression screen PHQ 2/9 08/11/2020 07/14/2020 03/06/2019  Decreased Interest 0 0 0  Down, Depressed, Hopeless 0 0 0  PHQ - 2 Score 0 0 0    Fall Risk  08/11/2020 07/14/2020 03/06/2019 12/02/2018  Falls in the past year? 0 0 0 0  Number falls in past yr: 0 0 0 0  Injury with Fall? 0 0 0 0  Follow up - Falls evaluation completed - -     Allergies  Allergen Reactions  . Amoxicillin Anaphylaxis    Has patient had a PCN reaction causing immediate rash, facial/tongue/throat swelling, SOB or lightheadedness with hypotension: Yes Has patient had a PCN reaction causing severe rash involving mucus membranes or skin necrosis: No Has patient had a PCN reaction that required hospitalization No Has patient had a PCN reaction occurring within the last 10 years: Yes If all of the above answers are "NO", then may proceed with Cephalosporin use.     Prior to Admission medications   Medication Sig Start Date End Date Taking? Authorizing Provider  acetaminophen (TYLENOL) 500 MG tablet Take 1,000 mg by mouth every 8 (eight) hours as needed for mild pain. Patient not taking: Reported on 07/14/2020    [provider]  glipiZIDE (GLUCOTROL) 5 MG tablet Take 1 tablet (5 mg total) by mouth daily before breakfast. 07/14/20   Myles Lipps, MD  lisinopril (ZESTRIL) 10 MG tablet Take 1 tablet (10 mg total) by mouth daily. 07/14/20   Myles Lipps, MD  metFORMIN (GLUCOPHAGE) 1000 MG tablet Take 1 tablet (1,000 mg total) by mouth 2 (two) times daily with a meal. 07/14/20   Myles Lipps, MD    Past Medical History:  Diagnosis Date  . Diabetes mellitus without complication (HCC)   . Heart murmur    echo 05 no problems    Past Surgical History:  Procedure Laterality Date  . BACK SURGERY  2010  . LUMBAR LAMINECTOMY/DECOMPRESSION MICRODISCECTOMY Bilateral 04/06/2017   Procedure: Microlumbar decompression L4-5 Bilateral ;  Surgeon: Jene Every, MD;  Location: [facility];  Service: Orthopedics;  Laterality: Bilateral;  Requests for 2 hrs  . SPINAL CORD DECOMPRESSION  12/25/2014   L3  L4   Dr Yevette Edwards    Social History   Tobacco Use  . Smoking status: Never Smoker  . Smokeless tobacco: Never Used  Substance Use Topics  . Alcohol use: Yes    Alcohol/week: 1.0 standard drink    Types: 1 Cans of beer per week    Comment: occas    Family History  Problem Relation Age of Onset  . Hypertension Mother   .  Healthy Sister   . Healthy Brother   . Healthy Daughter   . Healthy Son     Review of Systems  Constitutional: Negative for chills and fever.  Respiratory: Negative for cough and shortness of breath.   Cardiovascular: Negative for chest pain, palpitations and leg swelling.  Gastrointestinal: Negative for abdominal pain, nausea and vomiting.     OBJECTIVE:  Today's Vitals   08/11/20 1102  BP: 140/88  Pulse: 62  Temp: 97.8 F (36.6 C)  SpO2: 100%  Weight: 203 lb (92.1 kg)  Height: 5' 8.5" (1.74 m)   Body mass index is 30.42 kg/m.   Physical Exam Vitals and nursing note reviewed.  Constitutional:      Appearance: He is well-developed.  HENT:     Head:  Normocephalic and atraumatic.  Eyes:     Extraocular Movements: Extraocular movements intact.     Conjunctiva/sclera: Conjunctivae normal.     Pupils: Pupils are equal, round, and reactive to light.  Cardiovascular:     Rate and Rhythm: Normal rate and regular rhythm.     Heart sounds: No murmur heard.  No friction rub. No gallop.   Pulmonary:     Effort: Pulmonary effort is normal.     Breath sounds: Normal breath sounds. No wheezing, rhonchi or rales.  Musculoskeletal:     Cervical back: Neck supple.     Right lower leg: No edema.     Left lower leg: No edema.  Skin:    General: Skin is warm and dry.  Neurological:     Mental Status: He is alert and oriented to person, place, and time.     No results found for this or any previous visit (from the past 24 hour(s)).  No results found.   ASSESSMENT and PLAN  1. Essential hypertension Controlled. Continue current regime.   2. Type 2 diabetes mellitus with other specified complication, unspecified whether long term insulin use (HCC) Controlled. Continue current regime. Cont LFM  3. Need for vaccination - Pneumococcal polysaccharide vaccine 23-valent greater than or equal to 2yo subcutaneous/IM  4. Special screening for malignant neoplasms, colon - Cologuard  Other orders - glipiZIDE (GLUCOTROL) 5 MG tablet; Take 1 tablet (5 mg total) by mouth daily before breakfast. - lisinopril (ZESTRIL) 10 MG tablet; Take 1 tablet (10 mg total) by mouth daily. - metFORMIN (GLUCOPHAGE) 1000 MG tablet; Take 1 tablet (1,000 mg total) by mouth 2 (two) times daily with a meal.  Return in about 6 months (around 02/11/2021).    Myles Lipps, MD Primary Care at [facility] [address] [place], [place] [address] Ph.  [phone] Fax [phone]

## 2020-09-08 DIAGNOSIS — H04123 Dry eye syndrome of bilateral lacrimal glands: Secondary | ICD-10-CM | POA: Diagnosis not present

## 2020-09-08 DIAGNOSIS — E119 Type 2 diabetes mellitus without complications: Secondary | ICD-10-CM | POA: Diagnosis not present

## 2020-09-08 DIAGNOSIS — H35033 Hypertensive retinopathy, bilateral: Secondary | ICD-10-CM | POA: Diagnosis not present

## 2020-09-08 DIAGNOSIS — H524 Presbyopia: Secondary | ICD-10-CM | POA: Diagnosis not present

## 2020-09-08 LAB — HM DIABETES EYE EXAM

## 2020-09-24 ENCOUNTER — Other Ambulatory Visit: Payer: Self-pay | Admitting: Family Medicine

## 2020-10-06 DIAGNOSIS — Z1211 Encounter for screening for malignant neoplasm of colon: Secondary | ICD-10-CM | POA: Diagnosis not present

## 2020-10-15 LAB — COLOGUARD: COLOGUARD: NEGATIVE

## 2020-10-20 ENCOUNTER — Telehealth: Payer: Self-pay | Admitting: Emergency Medicine

## 2020-10-20 NOTE — Telephone Encounter (Signed)
Spoke to pt and he will be back to pick this up Devin Gilmore filled out and signed this for pt

## 2020-10-20 NOTE — Telephone Encounter (Signed)
10/20/2020 - PATIENT DROPPED OFF A FORM THAT DR. IRMA SIGNED FOR HIM WHEN SHE SAW HIM ON AUGUST [phone]. IT WAS AN APPLICATION FOR THE [facility]. THEY HAVE GIVEN HIM ANOTHER FORM TO BE FILLED OUT FOR THE [facility] AS WELL. I AM ROUTING THIS MESSAGE TO DR. MIGUEL SINCE DR. IRMA IS NO LONGER HERE AND THIS IS Monday (DR. MIGUEL'S DAY FOR DR. Darrell Jewel PAPERWORK). I WILL PUT THESE FORMS IN DR. MIGUEL'S CUBBY HOLE AT THE NURSE'S STATION. MBC

## 2020-10-21 LAB — COLOGUARD: Cologuard: NEGATIVE

## 2021-01-03 DIAGNOSIS — Z1152 Encounter for screening for COVID-19: Secondary | ICD-10-CM | POA: Diagnosis not present

## 2021-06-22 ENCOUNTER — Ambulatory Visit
Admission: EM | Admit: 2021-06-22 | Discharge: 2021-06-22 | Disposition: A | Discharge status: Home / Self Care | Payer: BC Managed Care – PPO | Attending: Internal Medicine | Admitting: Internal Medicine

## 2021-06-22 ENCOUNTER — Other Ambulatory Visit: Payer: Self-pay

## 2021-06-22 ENCOUNTER — Encounter: Payer: Self-pay | Admitting: Emergency Medicine

## 2021-06-22 DIAGNOSIS — I10 Essential (primary) hypertension: Secondary | ICD-10-CM | POA: Diagnosis not present

## 2021-06-22 DIAGNOSIS — M5410 Radiculopathy, site unspecified: Secondary | ICD-10-CM

## 2021-06-22 DIAGNOSIS — E1169 Type 2 diabetes mellitus with other specified complication: Secondary | ICD-10-CM

## 2021-06-22 DIAGNOSIS — K130 Diseases of lips: Secondary | ICD-10-CM

## 2021-06-22 MED ORDER — LISINOPRIL 10 MG PO TABS: 10.0000 mg | ORAL_TABLET | Freq: Every day | ORAL | 0 refills | Status: DC

## 2021-06-22 MED ORDER — METFORMIN HCL 1000 MG PO TABS: 1000.0000 mg | ORAL_TABLET | Freq: Two times a day (BID) | ORAL | 1 refills | Status: DC

## 2021-06-22 MED ORDER — GLIPIZIDE 5 MG PO TABS: 5.0000 mg | ORAL_TABLET | Freq: Every day | ORAL | 0 refills | Status: DC

## 2021-06-22 NOTE — ED Triage Notes (Signed)
Patient is c/o that his lips are turning black and peeling.  Patient is unsure if he's dehydrated, applying Neosporin, Blistix and Chapstick w/o results.  Patient is also needing a refill on his the following medication: Glipizide 5mg , Lisinopril 10mg , Metformin 1000mg . Patient does not have a PCP.  Last doctor he was seeing the practice has closed.  Patient c/o that his left arm goes to sleep x 2 weeks.  Patient denies any injury to his arm.

## 2021-06-22 NOTE — ED Provider Notes (Signed)
[facility]    CSN: [id number] Arrival date & time: 06/22/21  0943      History   Chief Complaint Chief Complaint  Patient presents with   Medication Refill    HPI Devin Gilmore is a 55 y.o. male with past medical history of hypertension and diabetes presents to urgent care today requesting medication refill.  Patient reports running out of BP meds just recently and diabetes meds approximately 3 weeks ago.  He denies any polyuria, polydipsia, polyphagia.  Was patient at [facility] and needing to find new PCP.  Patient also describes intermittent left arm "pricking", tingling times several months.  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  He denies any associated chest pain, SOB, palpitations or diaphoresis.  Patient denies any recent headache, dizziness, fever/chills, abdominal pain, N/V/D.  Has been using Chapstick and Blistex on dry lips without much improvement.   Past Medical History:  Diagnosis Date   Diabetes mellitus without complication (HCC)    Heart murmur    echo 05 no problems    Patient Active Problem List   Diagnosis Date Noted   Type 2 diabetes mellitus with other specified complication (HCC) 07/14/2020   Essential hypertension 07/14/2020   HNP (herniated nucleus pulposus), lumbar 04/06/2017   Spinal stenosis at L4-L5 level 04/06/2017   Radiculopathy 12/25/2014    Past Surgical History:  Procedure Laterality Date   BACK SURGERY  2010   LUMBAR LAMINECTOMY/DECOMPRESSION MICRODISCECTOMY Bilateral 04/06/2017   Procedure: Microlumbar decompression L4-5 Bilateral ;  Surgeon: Jene Every, MD;  Location: [facility];  Service: Orthopedics;  Laterality: Bilateral;  Requests for 2 hrs   SPINAL CORD DECOMPRESSION  12/25/2014   L3  L4   Dr Yevette Edwards       Home Medications    Prior to Admission medications   Medication Sig Start Date End Date Taking? Authorizing Provider  glipiZIDE  (GLUCOTROL) 5 MG tablet Take 1 tablet (5 mg total) by mouth daily before breakfast. 06/22/21 07/22/21  Rolla Etienne, NP  lisinopril (ZESTRIL) 10 MG tablet Take 1 tablet (10 mg total) by mouth daily. 06/22/21 07/22/21  Rolla Etienne, NP  metFORMIN (GLUCOPHAGE) 1000 MG tablet Take 1 tablet (1,000 mg total) by mouth 2 (two) times daily with a meal. 06/22/21   Rolla Etienne, NP    Family History Family History  Problem Relation Age of Onset   Hypertension Mother    Healthy Sister    Healthy Brother    Healthy Daughter    Healthy Son     Social History Social History   Tobacco Use   Smoking status: Never   Smokeless tobacco: Never  Substance Use Topics   Alcohol use: Yes    Alcohol/week: 1.0 standard drink    Types: 1 Cans of beer per week    Comment: occas   Drug use: No     Allergies   Amoxicillin   Review of Systems As stated in HPI otherwise negative   Physical Exam Triage Vital Signs ED Triage Vitals  Enc Vitals Group     BP 06/22/21 1010 (!) 167/64     Pulse Rate 06/22/21 1010 64     Resp --      Temp --      Temp src --      SpO2 06/22/21 1010 97 %     Weight --      Height --  Head Circumference --      Peak Flow --      Pain Score 06/22/21 1011 3     Pain Loc --      Pain Edu? --      Excl. in GC? --    No data found.  Updated Vital Signs BP (!) 167/64 (BP Location: Right Arm)   Pulse 64   SpO2 97%   Visual Acuity Right Eye Distance:   Left Eye Distance:   Bilateral Distance:    Right Eye Near:   Left Eye Near:    Bilateral Near:     Physical Exam Constitutional:      General: He is not in acute distress.    Appearance: Normal appearance. He is normal weight. He is not ill-appearing or toxic-appearing.  HENT:     Right Ear: External ear normal.     Left Ear: External ear normal.     Nose: Nose normal. No congestion or rhinorrhea.     Mouth/Throat:     Mouth: Mucous membranes are moist.     Pharynx: No oropharyngeal exudate or  posterior oropharyngeal erythema.     Comments: Mucous membranes moist, lips slightly dry with no lesions or skin breakdown Eyes:     Extraocular Movements: Extraocular movements intact.     Conjunctiva/sclera: Conjunctivae normal.  Neck:     Comments: + Spurling test Cardiovascular:     Rate and Rhythm: Normal rate and regular rhythm.     Heart sounds: No murmur heard.   No friction rub. No gallop.  Abdominal:     General: Bowel sounds are normal.     Palpations: Abdomen is soft.  Musculoskeletal:        General: Normal range of motion.     Cervical back: Normal range of motion and neck supple. No tenderness.  Skin:    General: Skin is warm and dry.  Neurological:     General: No focal deficit present.     Mental Status: He is alert and oriented to person, place, and time.  Psychiatric:        Mood and Affect: Mood normal.        Behavior: Behavior normal.     UC Treatments / Results  Labs (all labs ordered are listed, but only abnormal results are displayed) Labs Reviewed - No data to display  EKG   Radiology No results found.  Procedures Procedures (including critical care time)  Medications Ordered in UC Medications - No data to display  Initial Impression / Assessment and Plan / UC Course  I have reviewed the triage vital signs and the nursing notes.  Pertinent labs & imaging results that were available during my care of the patient were reviewed by me and considered in my medical decision making (see chart for details).  Hypertension, essential -off meds for a couple of days. Reasonable control -labs 07/2020 reviewed -refilled ACEI  DM2 -refill Metformin, glipizide  Left arm numbness, tingling -+ spurling test indicative of cervical radiculopathy  -no associated symptoms suggestive of ACS -Exercises and suggestions for symptomatic relief given  Discussion regarding follow-up precautions and importance of establishing primary care for management of  chronic diseases.  Referral to counseling primary care given. Reviewed expections re: course of current medical issues. Questions answered. Outlined signs and symptoms indicating need for more acute intervention. Pt verbalized understanding. AVS given    Final Clinical Impressions(s) / UC Diagnoses   Final diagnoses:  Essential hypertension  Type 2  diabetes mellitus with other specified complication, without long-term current use of insulin (HCC)  Radiculopathy, unspecified spinal region  Chapped lips     Discharge Instructions      Your medications have been refilled.  Please pick up and begin taking immediately.  I have given you the number for the primary care office next-door.  Please call and schedule an appointment to establish primary care.  I have given you some information to read on radiculopathy causing your left arm numbness and tingling.  You can use Eucerin ointment on your lips.  Make sure you are staying well-hydrated.  Please return for any worsening or persistent symptoms.  Go to the ER should you develop any chest pain, shortness of breath or palpitations     ED Prescriptions     Medication Sig Dispense Auth. Provider   glipiZIDE (GLUCOTROL) 5 MG tablet Take 1 tablet (5 mg total) by mouth daily before breakfast. 90 tablet Rolla Etienne, NP   lisinopril (ZESTRIL) 10 MG tablet Take 1 tablet (10 mg total) by mouth daily. 90 tablet Rolla Etienne, NP   metFORMIN (GLUCOPHAGE) 1000 MG tablet Take 1 tablet (1,000 mg total) by mouth 2 (two) times daily with a meal. 90 tablet Rolla Etienne, NP      PDMP not reviewed this encounter.   Rolla Etienne, NP 06/22/21 1130

## 2021-06-22 NOTE — Discharge Instructions (Addendum)
Your medications have been refilled.  Please pick up and begin taking immediately.  I have given you the number for the primary care office next-door.  Please call and schedule an appointment to establish primary care.  I have given you some information to read on radiculopathy causing your left arm numbness and tingling.  You can use Eucerin ointment on your lips.  Make sure you are staying well-hydrated.  Please return for any worsening or persistent symptoms.  Go to the ER should you develop any chest pain, shortness of breath or palpitations

## 2021-09-24 ENCOUNTER — Telehealth (HOSPITAL_COMMUNITY): Payer: Self-pay

## 2021-09-24 NOTE — Telephone Encounter (Signed)
Returned patients call regarding his request to have his medications refilled for another month.  Told patient he would have to come in for a medication refill visit or see PCP. Pt expressed understanding. Pt states he is unable to see PCP at the moment.

## 2021-10-04 ENCOUNTER — Other Ambulatory Visit: Payer: Self-pay

## 2021-10-04 ENCOUNTER — Ambulatory Visit
Admission: EM | Admit: 2021-10-04 | Discharge: 2021-10-04 | Disposition: A | Discharge status: Home / Self Care | Payer: BC Managed Care – PPO | Attending: Internal Medicine | Admitting: Internal Medicine

## 2021-10-04 DIAGNOSIS — I10 Essential (primary) hypertension: Secondary | ICD-10-CM | POA: Diagnosis not present

## 2021-10-04 DIAGNOSIS — E119 Type 2 diabetes mellitus without complications: Secondary | ICD-10-CM | POA: Diagnosis not present

## 2021-10-04 HISTORY — DX: Essential (primary) hypertension: I10

## 2021-10-04 MED ORDER — LISINOPRIL 10 MG PO TABS: 10.0000 mg | ORAL_TABLET | Freq: Every day | ORAL | 0 refills | Status: AC

## 2021-10-04 MED ORDER — METFORMIN HCL 1000 MG PO TABS: 1000.0000 mg | ORAL_TABLET | Freq: Two times a day (BID) | ORAL | 1 refills | Status: AC

## 2021-10-04 MED ORDER — GLIPIZIDE 5 MG PO TABS: 5.0000 mg | ORAL_TABLET | Freq: Every day | ORAL | 0 refills | Status: AC

## 2021-10-04 NOTE — ED Provider Notes (Signed)
[facility]    CSN: [id number] Arrival date & time: 10/04/21  0813      History   Chief Complaint Chief Complaint  Patient presents with   Medication Refill    HPI Devin Gilmore is a 55 y.o. male.   Patient presents for medication refill of glipizide, metformin, lisinopril.  Patient reports he has been out of these medications for approximately 2 to 3 days.  Patient has appointment with PCP on October 17 and needs medications to have available until able to be seen by PCP in a few days.  Patient reports that he has not seen a PCP in approximately 5 years.  Has been getting his medications refilled at various urgent cares.  Last blood work was completed approximately 1 year ago at a different urgent care.  Patient has been taking these medications for multiple years and has been tolerating well.  Denies chest pain, shortness of breath, polyuria, increased thirst, headache, blurred vision, dizziness, nausea, vomiting.   Medication Refill  Past Medical History:  Diagnosis Date   Diabetes mellitus without complication (HCC)    Heart murmur    echo 05 no problems   Hypertension     Patient Active Problem List   Diagnosis Date Noted   Type 2 diabetes mellitus with other specified complication (HCC) 07/14/2020   Essential hypertension 07/14/2020   HNP (herniated nucleus pulposus), lumbar 04/06/2017   Spinal stenosis at L4-L5 level 04/06/2017   Radiculopathy 12/25/2014    Past Surgical History:  Procedure Laterality Date   BACK SURGERY  2010   LUMBAR LAMINECTOMY/DECOMPRESSION MICRODISCECTOMY Bilateral 04/06/2017   Procedure: Microlumbar decompression L4-5 Bilateral ;  Surgeon: Jene Every, MD;  Location: [facility];  Service: Orthopedics;  Laterality: Bilateral;  Requests for 2 hrs   SPINAL CORD DECOMPRESSION  12/25/2014   L3  L4   Dr Yevette Edwards       Home Medications    Prior to Admission medications   Medication Sig Start Date End Date Taking?  Authorizing Provider  glipiZIDE (GLUCOTROL) 5 MG tablet Take 1 tablet (5 mg total) by mouth daily before breakfast. 10/04/21 11/03/21  Lance Muss, FNP  lisinopril (ZESTRIL) 10 MG tablet Take 1 tablet (10 mg total) by mouth daily. 10/04/21 11/03/21  Lance Muss, FNP  metFORMIN (GLUCOPHAGE) 1000 MG tablet Take 1 tablet (1,000 mg total) by mouth 2 (two) times daily with a meal. 10/04/21   Lance Muss, FNP    Family History Family History  Problem Relation Age of Onset   Hypertension Mother    Healthy Sister    Healthy Brother    Healthy Daughter    Healthy Son     Social History Social History   Tobacco Use   Smoking status: Never   Smokeless tobacco: Never  Substance Use Topics   Alcohol use: Yes    Alcohol/week: 1.0 standard drink    Types: 1 Cans of beer per week    Comment: occas   Drug use: No     Allergies   Amoxicillin   Review of Systems Review of Systems Per HPI  Physical Exam Triage Vital Signs ED Triage Vitals [10/04/21 0847]  Enc Vitals Group     BP (!) 169/90     Pulse Rate 60     Resp 18     Temp 97.8 F (36.6 C)     Temp Source Oral     SpO2 97 %     Weight  Height      Head Circumference      Peak Flow      Pain Score 0     Pain Loc      Pain Edu?      Excl. in GC?    No data found.  Updated Vital Signs BP (!) 169/90 (BP Location: Left Arm)   Pulse 60   Temp 97.8 F (36.6 C) (Oral)   Resp 18   SpO2 97%   Visual Acuity Right Eye Distance:   Left Eye Distance:   Bilateral Distance:    Right Eye Near:   Left Eye Near:    Bilateral Near:     Physical Exam Constitutional:      General: He is not in acute distress.    Appearance: Normal appearance. He is not toxic-appearing or diaphoretic.  HENT:     Head: Normocephalic and atraumatic.  Eyes:     Extraocular Movements: Extraocular movements intact.     Conjunctiva/sclera: Conjunctivae normal.     Pupils: Pupils are equal, round, and reactive to light.   Cardiovascular:     Rate and Rhythm: Normal rate and regular rhythm.     Pulses: Normal pulses.     Heart sounds: Normal heart sounds.  Pulmonary:     Effort: Pulmonary effort is normal. No respiratory distress.     Breath sounds: Normal breath sounds.  Skin:    General: Skin is warm and dry.  Neurological:     General: No focal deficit present.     Mental Status: He is alert and oriented to person, place, and time. Mental status is at baseline.  Psychiatric:        Mood and Affect: Mood normal.        Behavior: Behavior normal.        Thought Content: Thought content normal.        Judgment: Judgment normal.     UC Treatments / Results  Labs (all labs ordered are listed, but only abnormal results are displayed) Labs Reviewed - No data to display  EKG   Radiology No results found.  Procedures Procedures (including critical care time)  Medications Ordered in UC Medications - No data to display  Initial Impression / Assessment and Plan / UC Course  I have reviewed the triage vital signs and the nursing notes.  Pertinent labs & imaging results that were available during my care of the patient were reviewed by me and considered in my medical decision making (see chart for details).     Will refill medications for 30-day supply until patient is able to be seen by PCP.  Advised patient of the importance of being followed by PCP to manage medications and health best.  Patient was offered blood work but patient declined.  No red flags on exam.  Patient advised to restart medications as soon as possible.Discussed strict return precautions. Patient verbalized understanding and is agreeable with plan.  Final Clinical Impressions(s) / UC Diagnoses   Final diagnoses:  Essential hypertension  Type 2 diabetes mellitus without complication, without long-term current use of insulin [facility])     Discharge Instructions      Your medications have been refilled.  Please follow-up  with primary care soon as possible.     ED Prescriptions     Medication Sig Dispense Auth. Provider   glipiZIDE (GLUCOTROL) 5 MG tablet Take 1 tablet (5 mg total) by mouth daily before breakfast. 30 tablet Lance Muss, FNP  lisinopril (ZESTRIL) 10 MG tablet Take 1 tablet (10 mg total) by mouth daily. 30 tablet Lance Muss, FNP   metFORMIN (GLUCOPHAGE) 1000 MG tablet Take 1 tablet (1,000 mg total) by mouth 2 (two) times daily with a meal. 60 tablet Lance Muss, FNP      PDMP not reviewed this encounter.   Lance Muss, FNP 10/04/21 [phone]

## 2021-10-04 NOTE — Discharge Instructions (Addendum)
Your medications have been refilled.  Please follow-up with primary care soon as possible.

## 2021-10-04 NOTE — ED Triage Notes (Signed)
Pt states he needs metformin, lisinipril, and glipizide refilled. States he goes to provider on 17th and needs enough to last until then at minimum.

## 2021-10-12 DIAGNOSIS — I10 Essential (primary) hypertension: Secondary | ICD-10-CM | POA: Diagnosis not present

## 2021-10-12 DIAGNOSIS — J301 Allergic rhinitis due to pollen: Secondary | ICD-10-CM | POA: Diagnosis not present

## 2021-10-12 DIAGNOSIS — E559 Vitamin D deficiency, unspecified: Secondary | ICD-10-CM | POA: Diagnosis not present

## 2021-10-12 DIAGNOSIS — E1169 Type 2 diabetes mellitus with other specified complication: Secondary | ICD-10-CM | POA: Diagnosis not present

## 2021-10-12 DIAGNOSIS — B353 Tinea pedis: Secondary | ICD-10-CM | POA: Diagnosis not present

## 2021-11-02 DIAGNOSIS — E1169 Type 2 diabetes mellitus with other specified complication: Secondary | ICD-10-CM | POA: Diagnosis not present

## 2021-11-02 DIAGNOSIS — B353 Tinea pedis: Secondary | ICD-10-CM | POA: Diagnosis not present

## 2021-11-02 DIAGNOSIS — J301 Allergic rhinitis due to pollen: Secondary | ICD-10-CM | POA: Diagnosis not present

## 2021-11-02 DIAGNOSIS — I10 Essential (primary) hypertension: Secondary | ICD-10-CM | POA: Diagnosis not present

## 2021-11-30 DIAGNOSIS — Z23 Encounter for immunization: Secondary | ICD-10-CM | POA: Diagnosis not present

## 2021-11-30 DIAGNOSIS — E1169 Type 2 diabetes mellitus with other specified complication: Secondary | ICD-10-CM | POA: Diagnosis not present

## 2021-11-30 DIAGNOSIS — I10 Essential (primary) hypertension: Secondary | ICD-10-CM | POA: Diagnosis not present

## 2021-12-21 DIAGNOSIS — E119 Type 2 diabetes mellitus without complications: Secondary | ICD-10-CM | POA: Diagnosis not present

## 2021-12-21 DIAGNOSIS — H524 Presbyopia: Secondary | ICD-10-CM | POA: Diagnosis not present

## 2021-12-21 DIAGNOSIS — H52223 Regular astigmatism, bilateral: Secondary | ICD-10-CM | POA: Diagnosis not present

## 2021-12-21 DIAGNOSIS — H5213 Myopia, bilateral: Secondary | ICD-10-CM | POA: Diagnosis not present

## 2023-07-11 LAB — COLOGUARD: COLOGUARD: NEGATIVE

## 2024-12-03 ENCOUNTER — Encounter (HOSPITAL_BASED_OUTPATIENT_CLINIC_OR_DEPARTMENT_OTHER): Payer: Self-pay | Admitting: Emergency Medicine

## 2024-12-03 ENCOUNTER — Other Ambulatory Visit: Payer: Self-pay

## 2024-12-03 ENCOUNTER — Emergency Department (HOSPITAL_BASED_OUTPATIENT_CLINIC_OR_DEPARTMENT_OTHER)
Admission: EM | Admit: 2024-12-03 | Discharge: 2024-12-03 | Disposition: A | Discharge status: Home / Self Care | Attending: Emergency Medicine | Admitting: Emergency Medicine

## 2024-12-03 DIAGNOSIS — R7309 Other abnormal glucose: Secondary | ICD-10-CM

## 2024-12-03 LAB — URINALYSIS, ROUTINE W REFLEX MICROSCOPIC
Bacteria, UA: NONE SEEN
Bilirubin Urine: NEGATIVE
Glucose, UA: >1000 mg/dL — AB
Hgb urine dipstick: NEGATIVE
Ketones, ur: NEGATIVE mg/dL
Leukocytes,Ua: NEGATIVE
Nitrite: NEGATIVE
Protein, ur: NEGATIVE mg/dL
Specific Gravity, Urine: 1.021 (ref 1.005–1.030)
pH: 7 (ref 5.0–8.0)

## 2024-12-03 LAB — COMPREHENSIVE METABOLIC PANEL WITH GFR
ALT: 12 U/L (ref 0–44)
AST: 13 U/L — ABNORMAL LOW (ref 15–41)
Albumin: 4.3 g/dL (ref 3.5–5.0)
Alkaline Phosphatase: 65 U/L (ref 38–126)
Anion gap: 12 (ref 5–15)
BUN: 13 mg/dL (ref 6–20)
CO2: 25 mmol/L (ref 22–32)
Calcium: 10.1 mg/dL (ref 8.9–10.3)
Chloride: 101 mmol/L (ref 98–111)
Creatinine, Ser: 1.12 mg/dL (ref 0.61–1.24)
GFR, Estimated: >60 mL/min (ref >60)
Glucose, Bld: 132 mg/dL — ABNORMAL HIGH (ref 70–99)
Potassium: 4.4 mmol/L (ref 3.5–5.1)
Sodium: 137 mmol/L (ref 135–145)
Total Bilirubin: 0.5 mg/dL (ref 0.0–1.2)
Total Protein: 7.7 g/dL (ref 6.5–8.1)

## 2024-12-03 LAB — CBC
HCT: 44.5 % (ref 39.0–52.0)
Hemoglobin: 14.4 g/dL (ref 13.0–17.0)
MCH: 25.4 pg — ABNORMAL LOW (ref 26.0–34.0)
MCHC: 32.4 g/dL (ref 30.0–36.0)
MCV: 78.5 fL — ABNORMAL LOW (ref 80.0–100.0)
Platelets: 299 K/uL (ref 150–400)
RBC: 5.67 MIL/uL (ref 4.22–5.81)
RDW: 13.6 % (ref 11.5–15.5)
WBC: 5.9 K/uL (ref 4.0–10.5)
nRBC: 0 % (ref 0.0–0.2)

## 2024-12-03 LAB — CBG MONITORING, ED
Glucose-Capillary: 154 mg/dL — ABNORMAL HIGH (ref 70–99)
Glucose-Capillary: 118 mg/dL — ABNORMAL HIGH (ref 70–99)

## 2024-12-03 LAB — TROPONIN T, HIGH SENSITIVITY: Troponin T High Sensitivity: <15 ng/L (ref 0–19)

## 2024-12-03 MED ORDER — LACTATED RINGERS IV BOLUS
1000.0000 mL | Freq: Once | INTRAVENOUS | Status: AC
  Administered 2024-12-03: 1000 mL via INTRAVENOUS

## 2024-12-03 NOTE — ED Provider Notes (Signed)
 [place] [facility] AT [facility] Provider Note   CSN: [id number] Arrival date & time: 12/03/24  1550     Patient presents with: Blood Sugar Problem   Devin Gilmore is a 58 y.o. male.   HPI 58 year old male presents with low blood sugars.  For the past couple days he has been having blood sugars often drop into the 50s and 60s according to his device.  He has been getting a lot of alerts and he will drink some sort of sugary drink or snack and it will come back up.  He denies feeling any particular symptoms of hypoglycemia.  He was having issues with low blood sugar when he for started taking 10 mg Farxiga and so about 6 months ago he was dropped down to 5 mg and has had no issues over the past 6 months until now.  He is also on metformin .  The patient states that he has been feeling generally tired for the past week or so but no other symptoms including headache, chest pain, shortness of breath, vomiting, etc.  While in triage his blood sugar reader was measuring in the 50s but the fingerstick glucometer in triage was reading at 154.  Prior to Admission medications   Medication Sig Start Date End Date Taking? Authorizing Provider  glipiZIDE  (GLUCOTROL ) 5 MG tablet Take 1 tablet (5 mg total) by mouth daily before breakfast. 10/04/21 11/03/21  Hazen Darryle BRAVO, FNP  lisinopril  (ZESTRIL ) 10 MG tablet Take 1 tablet (10 mg total) by mouth daily. 10/04/21 11/03/21  Hazen Darryle BRAVO, FNP  metFORMIN  (GLUCOPHAGE ) 1000 MG tablet Take 1 tablet (1,000 mg total) by mouth 2 (two) times daily with a meal. 10/04/21   Hazen Darryle BRAVO, FNP    Allergies: Amoxicillin    Review of Systems  Constitutional:  Positive for fatigue. Negative for fever.  Respiratory:  Negative for shortness of breath.   Cardiovascular:  Negative for chest pain.  Neurological:  Negative for headaches.    Updated Vital Signs BP 128/79 (BP Location: Right Arm)   Pulse 64   Temp 98.5 F (36.9 C) (Oral)    Resp 14   SpO2 98%   Physical Exam Vitals and nursing note reviewed.  Constitutional:      General: He is not in acute distress.    Appearance: He is well-developed. He is not ill-appearing or diaphoretic.  HENT:     Head: Normocephalic and atraumatic.  Cardiovascular:     Rate and Rhythm: Normal rate and regular rhythm.     Heart sounds: Normal heart sounds.  Pulmonary:     Effort: Pulmonary effort is normal.     Breath sounds: Normal breath sounds.  Abdominal:     Palpations: Abdomen is soft.     Tenderness: There is no abdominal tenderness.  Skin:    General: Skin is warm and dry.  Neurological:     Mental Status: He is alert.     Comments: Awake, alert, clear speech. 5/5 strength in all 4 extremities.      (all labs ordered are listed, but only abnormal results are displayed) Labs Reviewed  CBC - Abnormal; Notable for the following components:      Result Value   MCV 78.5 (*)    MCH 25.4 (*)    All other components within normal limits  COMPREHENSIVE METABOLIC PANEL WITH GFR - Abnormal; Notable for the following components:   Glucose, Bld 132 (*)    AST 13 (*)  All other components within normal limits  URINALYSIS, ROUTINE W REFLEX MICROSCOPIC - Abnormal; Notable for the following components:   Color, Urine COLORLESS (*)    Glucose, UA >1,000 (*)    All other components within normal limits  CBG MONITORING, ED - Abnormal; Notable for the following components:   Glucose-Capillary 154 (*)    All other components within normal limits  CBG MONITORING, ED - Abnormal; Notable for the following components:   Glucose-Capillary 118 (*)    All other components within normal limits  TROPONIN T, HIGH SENSITIVITY    EKG: EKG Interpretation Date/Time:  Monday December 03 2024 17:20:31 EST Ventricular Rate:  58 PR Interval:  190 QRS Duration:  113 QT Interval:  425 QTC Calculation: 418 R Axis:   84  Text Interpretation: Sinus rhythm Borderline intraventricular  conduction delay  diffuse minimal ST elevations withour reciprocal changes Confirmed by Freddi Hamilton [phone]) on 12/03/2024 5:24:41 PM  Radiology: No results found.   Procedures   Medications Ordered in the ED  lactated ringers  bolus 1,000 mL (0 mLs Intravenous Stopped 12/03/24 1815)                                    Medical Decision Making Amount and/or Complexity of Data Reviewed Labs: ordered.    Details: Normal troponin, mild hyperglycemia ECG/medicine tests: ordered and independent interpretation performed.    Details: No STEMI   Patient presents with low glucoses according to his monitor.  However the seem to be asymptomatic and his monitor is not correlating with fingerstick glucoses or the lab metabolic panel here.  He was given some fluids.  ECG shows some subtle possible ST elevations but no reciprocal changes and he does not have any acute ischemic symptoms.  Troponin is negative.  He has felt fatigued for a week but nothing else specific.  I highly doubt this is a ACS or cardiac emergency.  Otherwise, I suspect it is a monitor problem rather than true hypoglycemia.  I discussed he can consider stopping his farxiga but needs to discuss with his doctor.  He does have a fingerstick monitor he can borrow from family that I discussed he should use and correlate with his values.  He otherwise appears stable for discharge with return precautions.     Final diagnoses:  Abnormal glucose measurement    ED Discharge Orders     None          Freddi Hamilton, MD 12/03/24 1932

## 2024-12-03 NOTE — ED Notes (Signed)
 ED Provider at bedside.

## 2024-12-03 NOTE — ED Triage Notes (Signed)
 Continuous CBG monitor has been dropping below 60 at home. Will have snack and appears to drop quickly (2-3hrs). Feeling fatigued past few days, no cough/n/v/d/fevers. Taking Farxiga x1 year has gone down to 5mg  from 10mg .  CBG read 154 on triage glucometer. Pt's meter read 60 at this time.

## 2024-12-03 NOTE — Discharge Instructions (Signed)
 There appears to be a disconnect between your true blood sugar and your measurement device.  Change it out as recommended and call your primary care provider tomorrow morning for further instructions.  You may also use a glucometer to help follow your blood sugar and compare to your device.  If you develop any new or worsening symptoms return to the ER.
